# Patient Record
Sex: Female | Born: 1935 | Race: Black or African American | Hispanic: No | Smoking: Former smoker
Health system: Southern US, Community
[De-identification: ages and names within clinical notes are randomized; demographics above are authoritative.]

## PROBLEM LIST (undated history)

## (undated) DIAGNOSIS — K5792 Diverticulitis of intestine, part unspecified, without perforation or abscess without bleeding: Secondary | ICD-10-CM

## (undated) DIAGNOSIS — E039 Hypothyroidism, unspecified: Secondary | ICD-10-CM

## (undated) DIAGNOSIS — K5790 Diverticulosis of intestine, part unspecified, without perforation or abscess without bleeding: Secondary | ICD-10-CM

## (undated) DIAGNOSIS — F0390 Unspecified dementia without behavioral disturbance: Secondary | ICD-10-CM

## (undated) DIAGNOSIS — M1990 Unspecified osteoarthritis, unspecified site: Secondary | ICD-10-CM

## (undated) DIAGNOSIS — F419 Anxiety disorder, unspecified: Secondary | ICD-10-CM

## (undated) DIAGNOSIS — R911 Solitary pulmonary nodule: Secondary | ICD-10-CM

## (undated) DIAGNOSIS — R569 Unspecified convulsions: Secondary | ICD-10-CM

## (undated) DIAGNOSIS — F329 Major depressive disorder, single episode, unspecified: Secondary | ICD-10-CM

## (undated) DIAGNOSIS — N189 Chronic kidney disease, unspecified: Secondary | ICD-10-CM

## (undated) DIAGNOSIS — F32A Depression, unspecified: Secondary | ICD-10-CM

## (undated) DIAGNOSIS — I10 Essential (primary) hypertension: Secondary | ICD-10-CM

## (undated) DIAGNOSIS — K219 Gastro-esophageal reflux disease without esophagitis: Secondary | ICD-10-CM

## (undated) HISTORY — PX: PARATHYROIDECTOMY: SHX19

## (undated) HISTORY — DX: Chronic kidney disease, unspecified: N18.9

## (undated) HISTORY — DX: Unspecified dementia, unspecified severity, without behavioral disturbance, psychotic disturbance, mood disturbance, and anxiety: F03.90

## (undated) HISTORY — DX: Essential (primary) hypertension: I10

## (undated) HISTORY — DX: Gastro-esophageal reflux disease without esophagitis: K21.9

## (undated) HISTORY — DX: Hypothyroidism, unspecified: E03.9

## (undated) HISTORY — DX: Diverticulosis of intestine, part unspecified, without perforation or abscess without bleeding: K57.90

## (undated) HISTORY — DX: Solitary pulmonary nodule: R91.1

---

## 1977-10-30 HISTORY — PX: CHOLECYSTECTOMY: SHX55

## 1990-10-30 HISTORY — PX: THYROID LOBECTOMY: SHX420

## 2005-11-06 ENCOUNTER — Ambulatory Visit: Payer: Self-pay | Admitting: Cardiology

## 2005-11-13 ENCOUNTER — Ambulatory Visit: Payer: Self-pay | Admitting: Cardiology

## 2005-12-07 ENCOUNTER — Ambulatory Visit: Payer: Self-pay | Admitting: Cardiology

## 2007-02-25 ENCOUNTER — Ambulatory Visit: Payer: Self-pay | Admitting: Cardiology

## 2007-07-16 ENCOUNTER — Ambulatory Visit: Payer: Self-pay | Admitting: Cardiology

## 2008-12-12 ENCOUNTER — Ambulatory Visit: Payer: Self-pay | Admitting: Cardiology

## 2011-02-02 DIAGNOSIS — R55 Syncope and collapse: Secondary | ICD-10-CM

## 2011-05-29 DIAGNOSIS — R072 Precordial pain: Secondary | ICD-10-CM

## 2011-06-28 ENCOUNTER — Inpatient Hospital Stay (HOSPITAL_COMMUNITY)
Admission: AD | Admit: 2011-06-28 | Discharge: 2011-06-29 | DRG: 249 | Disposition: A | Discharge status: Home / Self Care | Payer: Medicare Other | Source: Other Acute Inpatient Hospital | Attending: Cardiovascular Disease | Admitting: Cardiovascular Disease

## 2011-06-28 DIAGNOSIS — I2510 Atherosclerotic heart disease of native coronary artery without angina pectoris: Secondary | ICD-10-CM

## 2011-06-28 DIAGNOSIS — Z79899 Other long term (current) drug therapy: Secondary | ICD-10-CM

## 2011-06-28 DIAGNOSIS — F0390 Unspecified dementia without behavioral disturbance: Secondary | ICD-10-CM | POA: Diagnosis present

## 2011-06-28 DIAGNOSIS — L259 Unspecified contact dermatitis, unspecified cause: Secondary | ICD-10-CM | POA: Diagnosis present

## 2011-06-28 DIAGNOSIS — I200 Unstable angina: Secondary | ICD-10-CM | POA: Diagnosis present

## 2011-06-28 DIAGNOSIS — E039 Hypothyroidism, unspecified: Secondary | ICD-10-CM | POA: Diagnosis present

## 2011-06-28 DIAGNOSIS — R079 Chest pain, unspecified: Secondary | ICD-10-CM

## 2011-06-28 DIAGNOSIS — K219 Gastro-esophageal reflux disease without esophagitis: Secondary | ICD-10-CM | POA: Diagnosis present

## 2011-06-28 DIAGNOSIS — N189 Chronic kidney disease, unspecified: Secondary | ICD-10-CM | POA: Diagnosis present

## 2011-06-28 DIAGNOSIS — I129 Hypertensive chronic kidney disease with stage 1 through stage 4 chronic kidney disease, or unspecified chronic kidney disease: Secondary | ICD-10-CM | POA: Diagnosis present

## 2011-06-28 DIAGNOSIS — Z7902 Long term (current) use of antithrombotics/antiplatelets: Secondary | ICD-10-CM

## 2011-06-28 DIAGNOSIS — Z7982 Long term (current) use of aspirin: Secondary | ICD-10-CM

## 2011-06-28 HISTORY — PX: CARDIAC CATHETERIZATION: SHX172

## 2011-06-29 DIAGNOSIS — I200 Unstable angina: Secondary | ICD-10-CM

## 2011-06-29 LAB — BASIC METABOLIC PANEL WITH GFR
BUN: 21 mg/dL (ref 6–23)
CO2: 27 meq/L (ref 19–32)
Calcium: 8.9 mg/dL (ref 8.4–10.5)
Chloride: 102 meq/L (ref 96–112)
Creatinine, Ser: 1.2 mg/dL — ABNORMAL HIGH (ref 0.50–1.10)
GFR calc Af Amer: 52 mL/min — ABNORMAL LOW
GFR calc non Af Amer: 43 mL/min — ABNORMAL LOW
Glucose, Bld: 102 mg/dL — ABNORMAL HIGH (ref 70–99)
Potassium: 3.6 meq/L (ref 3.5–5.1)
Sodium: 137 meq/L (ref 135–145)

## 2011-06-29 LAB — CBC
HCT: 32.6 % — ABNORMAL LOW (ref 36.0–46.0)
Hemoglobin: 11 g/dL — ABNORMAL LOW (ref 12.0–15.0)
MCH: 27.2 pg (ref 26.0–34.0)
MCHC: 33.7 g/dL (ref 30.0–36.0)
MCV: 80.5 fL (ref 78.0–100.0)
Platelets: 174 10*3/uL (ref 150–400)
RBC: 4.05 MIL/uL (ref 3.87–5.11)
RDW: 14.9 % (ref 11.5–15.5)
WBC: 8.8 10*3/uL (ref 4.0–10.5)

## 2011-06-29 LAB — LIPID PANEL
Cholesterol: 168 mg/dL (ref 0–200)
HDL: 50 mg/dL
LDL Cholesterol: 100 mg/dL — ABNORMAL HIGH (ref 0–99)
Total CHOL/HDL Ratio: 3.4 ratio
Triglycerides: 88 mg/dL
VLDL: 18 mg/dL (ref 0–40)

## 2011-06-29 LAB — POCT ACTIVATED CLOTTING TIME: Activated Clotting Time: 843 s

## 2011-06-29 NOTE — Discharge Summary (Addendum)
NAMESHAMEL, GALYEAN NO.:  [id number]  MEDICAL RECORD NO.:  [id number]  LOCATION:  6526                         FACILITY:  [facility]  PHYSICIAN:  Verne Carrow, MDDATE OF BIRTH:  Aug 09, 1928  DATE OF ADMISSION:  06/28/2011 DATE OF DISCHARGE:  06/29/2011                              DISCHARGE SUMMARY   DISCHARGE DIAGNOSES: 1. Unstable angina with newly diagnosed coronary artery disease this     admission.     a.     Status post angioplasty and bare-metal stent placement to      the proximal right coronary artery, June 28, 2011.     b.     Normal left ventricular function with ejection fraction of      60% by catheterization. 2. Hypertension. 3. Hypothyroidism. 4. Gastroesophageal reflux disease. 5. History of chronic kidney disease. 6. Very mild dementia. 7. Eczema. 8. History of diverticulosis. 9. Status post cholecystectomy. 10.Status post left thyroid lobectomy in 1992 for benign lesion. 11.Status post partial parathyroidectomy.  HOSPITAL COURSE:  Ms. Kirchner is an 77 year old female who has a history of hypertension and prior negative stress test in 2007 who presented to [facility] with complaints of chest pain.  She was recently admitted in July of left-sided chest pain and abdominal pain.  She ruled out for MI with negative troponin and nonspecific ST-T changes per EKG. She underwent myocardial perfusion study at that time which was essentially within normal limits.  EF was 61%.  However, she presented to [facility] with complaints of severe substernal chest pain as well as tightness that radiate to the neck and shoulders.  She appeared clammy and presyncopal and was very short of breath.  She was brought to the ER via EMS and received nitroglycerin with rapid resolution of her symptoms.  EKG showed no ischemic changes, only LVH.  Initial cardiac markers were cycled which were negative.  Despite her negative cardiac markers, her  story was felt concerning for unstable angina and she was felt suitable for cardiac catheterization.  She was transferred to [facility] and underwent this procedure on June 28, 2011 demonstrating severe one-vessel CAD in the proximal RCA for which Dr. Kirke Corin performed bare-metal stent placement to the proximal RCA.  He recommended aspirin daily indefinitely as well as Plavix 75 mg once daily for at least 1 month and ideally for 9 months.  The patient is statin intolerant.  She was started on a beta-blocker.  On day of discharge, she is feeling better and has ambulated with cardiac rehab well without chest pain or shortness of breath.  Dr. Clifton James has seen and examined her today and feels she is stable for discharge.  DISCHARGE LABORATORY DATA:  WBC 8.8, hemoglobin 11, hematocrit 32.6, and platelet count 174.  Sodium 137, potassium 3.6, chloride 102, CO2 27, glucose 102, BUN 21, and creatinine 0.2.  Cholesterol 168, triglycerides 88, HDL 60, and LDL 100.  STUDIES:  Cardiac catheterization, June 28, 2011, please see full report for details as well as HPI for summary.  DISCHARGE MEDICATIONS: 1. Plavix 75 mg daily. 2. Metoprolol tartrate 25 mg b.i.d. 3. Nitroglycerin sublingual 0.4 mg every 5 minutes as needed up  to 3     doses for chest pain. 4. Amlodipine 5 mg daily. 5. Aspirin 81 mg daily. 6. Levothyroxine 100 mcg daily. 7. Lorazepam 1 mg t.i.d. 8. Losartan 100 mg daily. 9. Meclizine 12.5 mg daily as needed for dizziness.  Per Dr. Clifton James, Nexium will be discontinued.  DISPOSITION:  Kelsey Brennan will be discharged in stable condition to home.  She is not to lift anything over 5 pounds for 1 week, drive for 2 days, and not to participate in sexual activity for 1 week.  She should follow a low-sodium, heart-healthy diet.  To call or return if she notices any pain, swelling, bleeding, or pus at her cath site.  Our office will call her with a followup appointment for Dr.  Margarita Mail office.  DURATION OF DISCHARGE ENCOUNTER:  Greater than 30 minutes including physician and PA time.     Ronie Spies, P.A.C.   ______________________________ Verne Carrow, MD    DD/MEDQ  D:  06/29/2011  T:  06/29/2011  Job:  [id number]  cc:   [name], MD,FACC [name], MD  Electronically Signed by Verne Carrow MD on 06/29/2011 05:02:35 PM Electronically Signed by Ronie Spies  on 07/10/2011 07:32:26 PM

## 2011-07-11 ENCOUNTER — Encounter: Payer: Self-pay | Admitting: *Deleted

## 2011-07-18 NOTE — Cardiovascular Report (Signed)
NAMEHONESTII, Kelsey Brennan NO.:  [id number]  MEDICAL RECORD NO.:  [id number]  LOCATION:                                 FACILITY:  PHYSICIAN:  [name], MD          DATE OF BIRTH:  DATE OF PROCEDURE:  06/28/2011 DATE OF DISCHARGE:                           CARDIAC CATHETERIZATION   PRIMARY CARE PHYSICIAN:  [name], MD, in [place].  PRIMARY CARDIOLOGIST:  [name], MD,FACC  PROCEDURES PERFORMED: 1. Left heart catheterization. 2. Coronary angiography. 3. Left ventricular angiography. 4. Right coronary artery angioplasty and bare-metal stent placement to     99% proximal right coronary artery which resulted in 0% residual     stenosis with TIMI 3 flow. 5. Perclose closure device.  INDICATIONS AND CLINICAL HISTORY:  This is an 77 year old female with no previous cardiac history.  She presented last month with symptoms of chest pain.  She had a nuclear stress test done which showed no clear evidence of ischemia.  The patient returned back this morning with the symptoms of substernal chest tightness associated with nausea and feeling sick in her stomach.  Her symptoms were highly suggestive of unstable angina and thus she was transferred for cardiac catheterization and possible coronary intervention.  Risks, benefits, and alternatives were discussed with the patient and family.  STUDY DETAILS:  A standard informed consent was obtained.  The right groin area was prepped in a sterile fashion.  It was anesthetized with 1% lidocaine.  Coronary angiography was performed with a JL-4, JR-4, and a pigtail catheter.  All catheter exchanges were done over the wire.  INTERVENTIONAL PROCEDURE NOTE:  The patient was found to have a 99% proximal right coronary artery stenosis and thus decided to proceed with an angioplasty and stent placement.  The sheath was exchanged to a 6- [place] sheath.  She already received aspirin 325 mg.  She was given 600 mg of Plavix  as well as Pepcid.  IV bivalirudin was initiated with therapeutic ACT.  I used JR-4 guiding catheter with no side holes.  The lesion was crossed with an intuition wire with slight difficulty.  The lesion was predilated with a 2.5 x 12 mm Emerge balloon with two inflations to 10 atmospheres.  I then placed a 3.5 x 15 mm Multilink Vision stent and deployed it to 12 atmospheres.  This was postdilated with a 4.0 x 12 mm Golden Quantum apex to 10 atmospheres distally and 14 atmospheres proximally.  Final angiography showed excellent results with 0% residual stenosis and TIMI 3 flow with step-up and step-down.  The guiding catheter was removed over the wire.  The sheath was removed and the site was closed with a Perclose device after performing right femoral angiography.  STUDY FINDINGS:  Hemodynamic findings:  The left ventricular pressure is 168/1 with a left ventricular end-diastolic pressure of 7 mmHg.  Aortic pressure is 167/75 with a mean pressure of 112 mmHg.  Left ventricular angiography:  This showed normal LV systolic function and wall motion with an estimated ejection fraction of 60%.  CORONARY ANGIOGRAPHY: 1. Left main coronary artery:  The vessel is normal in size and  free     of significant disease. 2. Left anterior descending artery:  The vessel is normal in size and     slightly wraps around the apex.  There is a 20% tubular stenosis     proximally and 20% tubular stenosis in the mid segment.  The rest     of the vessel has minor irregularities without any significant     obstructive disease.  Diagonals are overall small in size and free     of significant disease. 3. Left circumflex artery:  The vessel is normal in size and     nondominant.  It has minor irregularities.  OM-1 is a large-sized     branch with 30% ostial stenosis.  OM-2 and OM-3 are free of     significant disease. 4. Right coronary artery:  The vessel is large in size and dominant.     There is a 99% tubular  stenosis proximally right after giving an     atrial branch.  The rest of the artery is actually smooth and free     of significant disease.  The PDA is normal in size and free of     significant disease.  There are two small-sized posterolateral     branches which are free of significant disease.  STUDY CONCLUSIONS: 1. Severe one-vessel coronary artery disease in the proximal right     coronary artery. 2. Normal LV systolic function and left ventricular end-diastolic     pressure. 3. Successful angioplasty and bare-metal stent placement to the     proximal right coronary artery for a 95% stenosis which resulted in     0% residual stenosis.  A total of 105 mL of contrast was used.  RECOMMENDATIONS:  I recommend aspirin daily indefinitely as well as Plavix 75 mg once daily for at least 1 month and ideally for 9 months. Aggressive treatment of risk factor is recommended.     [name], MD     MA/MEDQ  D:  06/28/2011  T:  06/28/2011  Job:  [id number]  cc:   [name], MD [name], MD,FACC  Electronically Signed by [name] MD on 07/18/2011 11:24:46 AM

## 2011-07-28 ENCOUNTER — Ambulatory Visit (INDEPENDENT_AMBULATORY_CARE_PROVIDER_SITE_OTHER): Payer: Medicare Other | Admitting: Cardiology

## 2011-07-28 ENCOUNTER — Encounter: Payer: Self-pay | Admitting: *Deleted

## 2011-07-28 VITALS — BP 90/60 | HR 61 | Ht 71.0 in | Wt 231.0 lb

## 2011-07-28 DIAGNOSIS — Z9009 Acquired absence of other part of head and neck: Secondary | ICD-10-CM | POA: Insufficient documentation

## 2011-07-28 DIAGNOSIS — I2510 Atherosclerotic heart disease of native coronary artery without angina pectoris: Secondary | ICD-10-CM

## 2011-07-28 DIAGNOSIS — E039 Hypothyroidism, unspecified: Secondary | ICD-10-CM | POA: Insufficient documentation

## 2011-07-28 DIAGNOSIS — I959 Hypotension, unspecified: Secondary | ICD-10-CM | POA: Insufficient documentation

## 2011-07-28 DIAGNOSIS — Z9889 Other specified postprocedural states: Secondary | ICD-10-CM | POA: Insufficient documentation

## 2011-07-28 NOTE — Assessment & Plan Note (Signed)
No recurrent chest pain. At this point the patient will be continued for at least a month on Plavix therapy. She has had some problems with dizziness and falls which is likely related to hypotension and orthostasis. If she has recurrent problems and we cannot correct properly or orthostasis he may want to consider stopping Plavix after one month. If not would continue Plavix for at least 6 months.

## 2011-07-28 NOTE — Assessment & Plan Note (Addendum)
I discontinued losartan. The patient may still be on amlodipine but that is not clear from her current medication list. Her family will check on this. I've asked her to increase her fluid intake and apparently she doesn't drink very much. The blood pressure remains low then amlodipine should also be discontinued. The patient's LV function is normal is not a diabetic so there is no clear indication for losartan regardless.

## 2011-07-28 NOTE — Patient Instructions (Signed)
   Stop Losartan  G2 - gatorade - drink plenty  Nurse visit next week for blood pressure, orthostatics Follow up in  3 months - see above

## 2011-07-28 NOTE — Progress Notes (Signed)
History of present illness: The patient is an 77 year old female with a history of hypertension and a negative stress test in 2007. She was recently admitted at the end of August with unstable angina. She underwent cardiac catheterization and underwent stent placement with a bare-metal stent to the proximal right coronary artery 06/28/2011. She had normal LV function. She presents for followup and has been doing well. She reports no recurrent chest pain. She has other cardiac risk factors including hypertension and other medical problems including hypothyroidism, GERD chronic kidney disease and mild dementia. The patient is status post prior cholecystectomy and left thyroid lobectomy 1992 for benign lesion. She is also status post partial parathyroidectomy  Allergies-family history social history are recorded in the chart  Past medical history is most recorded above and below in the problem list and was reviewed  Medications are listed in the chart  Review of systems: The patient reports some dizziness. She even had several falls since her recent discharge. Her blood pressure has been running low. She reports bloating and flatulence. She reports no bruising with the use of Plavix despite her recent falls. She reports no true syncope. No orthopnea PND palpitations. No melena hematochezia no dysuria or frequency.  Physical examination: Vital signs as reported below with systolic blood pressure is quite low around 90 mm of mercury HEENT: Pupils isocoric, normal oropharynx. Normal carotid upstroke no carotid bruits. No thyromegaly nonnodular thyroid Lungs: Clear breath sounds bilaterally Heart: Regular rate and rhythm with normal S1 and S2 no murmur rubs or gallops Abdomen is soft nontender no rebound or guarding the bowel sounds Extremity exam no cyanosis clubbing or edema Neuro patient alert oriented grossly nonfocal with mild dementia Psychiatric normal affect  Cardiac catheterization results  were reviewed.

## 2011-08-03 ENCOUNTER — Ambulatory Visit (INDEPENDENT_AMBULATORY_CARE_PROVIDER_SITE_OTHER): Payer: Medicare Other | Admitting: *Deleted

## 2011-08-03 DIAGNOSIS — I959 Hypotension, unspecified: Secondary | ICD-10-CM

## 2011-08-03 MED ORDER — LOSARTAN POTASSIUM 100 MG PO TABS: 100.0000 mg | ORAL_TABLET | Freq: Every day | ORAL | Status: DC

## 2011-08-03 NOTE — Progress Notes (Signed)
I reviewed orthostatic blood pressures. At baseline the patient is quite hypertensive and hypotension appears to have resolved. We will resume losartan however the patient appears to have chronotropic insufficiency with no increase in heart rate during standing position. We will discontinue beta blocker in the interim. We will also apply compression stockings. We'll bring the patient back next week for blood pressure check and further adjustments of medications if needed. The patient can decrease her electrolyte intake.

## 2011-08-03 NOTE — Progress Notes (Signed)
Pt presents for orthostatic blood pressures following recent office visit with hypotension. Pt's Losartan was d/c'd at this time. Pt became dizzy during the 2 min standing BP and requested to sit down. BP had dropped considerable.   Dr. Earnestine Leys notified.

## 2011-08-03 NOTE — Patient Instructions (Signed)
   Return Monday as scheduled.  Stop Metoprolol today.  Resume Losartan at previous dose.  Wear compression stockings.

## 2011-08-07 ENCOUNTER — Ambulatory Visit (INDEPENDENT_AMBULATORY_CARE_PROVIDER_SITE_OTHER): Payer: Medicare Other | Admitting: *Deleted

## 2011-08-07 DIAGNOSIS — I4589 Other specified conduction disorders: Secondary | ICD-10-CM

## 2011-08-07 DIAGNOSIS — I2510 Atherosclerotic heart disease of native coronary artery without angina pectoris: Secondary | ICD-10-CM

## 2011-08-08 NOTE — Patient Instructions (Addendum)
   Rx given for 20-65mmhg compression stockings, below knee  Follow up OV scheduled for 11/8 with GD  No medication changes

## 2011-08-28 ENCOUNTER — Other Ambulatory Visit: Payer: Self-pay | Admitting: Internal Medicine

## 2011-09-07 ENCOUNTER — Ambulatory Visit: Payer: Medicare Other | Admitting: Cardiology

## 2011-09-20 DIAGNOSIS — R072 Precordial pain: Secondary | ICD-10-CM

## 2011-10-05 ENCOUNTER — Inpatient Hospital Stay (HOSPITAL_COMMUNITY)
Admission: AD | Admit: 2011-10-05 | Discharge: 2011-10-07 | DRG: 884 | Disposition: A | Discharge status: Home / Self Care | Payer: Medicare Other | Source: Other Acute Inpatient Hospital | Attending: Neurology | Admitting: Neurology

## 2011-10-05 DIAGNOSIS — Z87891 Personal history of nicotine dependence: Secondary | ICD-10-CM

## 2011-10-05 DIAGNOSIS — N189 Chronic kidney disease, unspecified: Secondary | ICD-10-CM | POA: Diagnosis present

## 2011-10-05 DIAGNOSIS — D509 Iron deficiency anemia, unspecified: Secondary | ICD-10-CM | POA: Diagnosis present

## 2011-10-05 DIAGNOSIS — Z7982 Long term (current) use of aspirin: Secondary | ICD-10-CM

## 2011-10-05 DIAGNOSIS — I129 Hypertensive chronic kidney disease with stage 1 through stage 4 chronic kidney disease, or unspecified chronic kidney disease: Secondary | ICD-10-CM | POA: Diagnosis present

## 2011-10-05 DIAGNOSIS — Z9861 Coronary angioplasty status: Secondary | ICD-10-CM

## 2011-10-05 DIAGNOSIS — F0390 Unspecified dementia without behavioral disturbance: Principal | ICD-10-CM | POA: Diagnosis present

## 2011-10-05 DIAGNOSIS — I2510 Atherosclerotic heart disease of native coronary artery without angina pectoris: Secondary | ICD-10-CM | POA: Diagnosis present

## 2011-10-05 DIAGNOSIS — Z79899 Other long term (current) drug therapy: Secondary | ICD-10-CM

## 2011-10-05 DIAGNOSIS — E890 Postprocedural hypothyroidism: Secondary | ICD-10-CM | POA: Diagnosis present

## 2011-10-05 DIAGNOSIS — K219 Gastro-esophageal reflux disease without esophagitis: Secondary | ICD-10-CM | POA: Diagnosis present

## 2011-10-05 MED ORDER — ASPIRIN 325 MG PO TABS
325.0000 mg | ORAL_TABLET | Freq: Every day | ORAL | Status: DC
  Administered 2011-10-05 – 2011-10-07 (×3): 325 mg via ORAL
  Filled 2011-10-05 (×3): qty 1

## 2011-10-05 MED ORDER — DIVALPROEX SODIUM ER 250 MG PO TB24
250.0000 mg | ORAL_TABLET | Freq: Every day | ORAL | Status: DC
  Administered 2011-10-05 – 2011-10-07 (×3): 250 mg via ORAL
  Filled 2011-10-05 (×3): qty 1

## 2011-10-05 MED ORDER — METOPROLOL TARTRATE 25 MG PO TABS
25.0000 mg | ORAL_TABLET | Freq: Two times a day (BID) | ORAL | Status: DC
  Administered 2011-10-05 – 2011-10-07 (×4): 25 mg via ORAL
  Filled 2011-10-05 (×5): qty 1

## 2011-10-05 MED ORDER — CLOPIDOGREL BISULFATE 75 MG PO TABS
75.0000 mg | ORAL_TABLET | Freq: Every day | ORAL | Status: DC
  Administered 2011-10-06 – 2011-10-07 (×2): 75 mg via ORAL
  Filled 2011-10-05 (×3): qty 1

## 2011-10-05 MED ORDER — LEVOTHYROXINE SODIUM 100 MCG PO TABS
100.0000 ug | ORAL_TABLET | Freq: Every day | ORAL | Status: DC
  Administered 2011-10-06 – 2011-10-07 (×2): 100 ug via ORAL
  Filled 2011-10-05 (×3): qty 1

## 2011-10-05 MED ORDER — NITROGLYCERIN 0.4 MG SL SUBL: 0.4000 mg | SUBLINGUAL_TABLET | SUBLINGUAL | Status: DC | PRN

## 2011-10-05 MED ORDER — LOSARTAN POTASSIUM 50 MG PO TABS
100.0000 mg | ORAL_TABLET | Freq: Every day | ORAL | Status: DC
  Administered 2011-10-05 – 2011-10-07 (×3): 100 mg via ORAL
  Filled 2011-10-05 (×3): qty 2

## 2011-10-05 MED ORDER — NITROGLYCERIN 0.3 MG SL SUBL
0.3000 mg | SUBLINGUAL_TABLET | SUBLINGUAL | Status: DC | PRN
  Filled 2011-10-05: qty 100

## 2011-10-05 MED ORDER — LORAZEPAM 1 MG PO TABS
1.0000 mg | ORAL_TABLET | Freq: Three times a day (TID) | ORAL | Status: DC | PRN
  Administered 2011-10-06: 1 mg via ORAL
  Filled 2011-10-05: qty 1

## 2011-10-05 NOTE — Progress Notes (Signed)
In report, I was told that the patient had been seen for confusion and weakness and was under the impression that the patient was coming here for a stroke workup. I did and NIH stroke scale and stroke swallow screen on the patient. But the MD later told me the patient was not here for stroke and to let the patient eat. However, when she took a sip of water through a straw, she coughed after. I called the MD back and he placed her on a DYS 1 Diet with Honey liquids until SLP can evaluate tomorrow.

## 2011-10-05 NOTE — Consult Note (Signed)
Reason for Consult:"confusion"  HPI: Kelsey Brennan is an 77 y.o. Female who was transferred from another hospital for episodes of confusion that were believed to be TIA's. Patient has had negative imaging each time per report in the other facility. Patient states that she had had visual hallucinations of people in the evenings and she knows that they are not real because she would reach out to try and touch them and the hand would pass through them. She says that this has been happening for about a year and thinks it has occurred about 10 times. Her son is also concerned regarding possible balance problems - she walks with walker at baseline.  Past Medical History   Diagnosis  Date   .  Hypertension    .  Dementia    .  Hypothyroidism    .  GERD (gastroesophageal reflux disease)    .  Chronic kidney disease    .  Diverticulosis    .  Nodule of right lung      noted to be stable on CT scans    Past Surgical History   Procedure  Date   .  Cardiac catheterization  06/28/2011     Severe one-vessel coronary artery disease in the proximal right coronary artery. Normal LV systolic function and left ventricular end-diastolic pressure. Successful angioplasty and bare-metal stent placement to the proximal right coronary artery for a 95% stenosis which resulted in 0% residual stenosis. A total of 105 mL of contrast was used.   .  Cholecystectomy  1979   .  Thyroid lobectomy  1992     benign lesion   .  Parathyroidectomy      partial    Family History   Problem  Relation  Age of Onset   .  Heart attack  Father    .  Other  Mother       died age 38 of some type of blood disease    .  Stroke  Sister    .  Alcohol abuse  Sister    Social History: reports that she quit smoking about 32 years ago. Her smoking use included Cigarettes. She has a 60 pack-year smoking history. She has never used smokeless tobacco. She reports that she does not drink alcohol or use illicit drugs.  Allergies:  Allergies    Allergen  Reactions   .  Bee Pollen  Swelling   .  Darvocet (Propoxyphene N-Acetaminophen)      hallucinations   .  Penicillins  Swelling   .  Statins      myalgias   Medications: I have reviewed the patient's current medications.  ROS: as above  Blood pressure 201/96, pulse 63, temperature 98.5 F (36.9 C), temperature source Oral, resp. rate 17, SpO2 100.00%.  Neurological exam: AAO*3. No aphasia. Recall was 3 of 3 after 5 minutes. Followed complex commands. Cranial nerves: EOMI, PERRL. Visual fields were full. Sensation to V1 through V3 areas of the face was intact and symmetric throughout. There was no facial asymmetry. Shoulder shrug was 5/5 and symmetric bilaterally. Head rotation was 5/5 bilaterally. There was no dysarthria or palatal deviation. Motor: strength was 5/5 and symmetric throughout. Sensory: was intact throughout to light touch, pinprick. Coordination: finger-to-nose and heel-to-shin were intact and symmetric bilaterally. Reflexes: were 1+ in upper extremities and 1+ at the knees and trace at the ankles. Plantar response was downgoing bilaterally. Gait: deferred  Assessment/Plan:  77-years-old woman with visual hallucinations and episodes of 

## 2011-10-06 ENCOUNTER — Inpatient Hospital Stay (HOSPITAL_COMMUNITY): Payer: Medicare Other

## 2011-10-06 LAB — CBC
HCT: 31.3 % — ABNORMAL LOW (ref 36.0–46.0)
Hemoglobin: 9.9 g/dL — ABNORMAL LOW (ref 12.0–15.0)
MCH: 27 pg (ref 26.0–34.0)
MCHC: 31.6 g/dL (ref 30.0–36.0)

## 2011-10-06 LAB — BASIC METABOLIC PANEL
BUN: 19 mg/dL (ref 6–23)
Calcium: 9 mg/dL (ref 8.4–10.5)
GFR calc non Af Amer: 39 mL/min — ABNORMAL LOW (ref >90)
Glucose, Bld: 105 mg/dL — ABNORMAL HIGH (ref 70–99)
Sodium: 143 mEq/L (ref 135–145)

## 2011-10-06 MED ORDER — SODIUM CHLORIDE 0.9 % IV SOLN
Freq: Once | INTRAVENOUS | Status: AC
  Administered 2011-10-06: 17:00:00 via INTRAVENOUS

## 2011-10-06 MED ORDER — OCUVITE PO TABS
1.0000 | ORAL_TABLET | Freq: Every day | ORAL | Status: DC
  Administered 2011-10-07: 1 via ORAL
  Filled 2011-10-06: qty 1

## 2011-10-06 MED ORDER — DONEPEZIL HCL 5 MG PO TABS
5.0000 mg | ORAL_TABLET | Freq: Every day | ORAL | Status: DC
  Administered 2011-10-06: 5 mg via ORAL
  Filled 2011-10-06 (×2): qty 1

## 2011-10-06 MED ORDER — RISPERIDONE 0.5 MG PO TABS
0.5000 mg | ORAL_TABLET | Freq: Every day | ORAL | Status: DC
  Administered 2011-10-06: 0.5 mg via ORAL
  Filled 2011-10-06 (×3): qty 1

## 2011-10-06 NOTE — Progress Notes (Addendum)
Physical Therapy Evaluation Patient Details Name: Kelsey Brennan MRN: [id number] DOB: 04-21-1928 Today's Date: 10/06/2011  Problem List:  Patient Active Problem List  Diagnoses  . Coronary artery disease  . Hypotension  . Hypothyroidism  . History of thyroidectomy, subtotal  . S/P subtotal parathyroidectomy    Past Medical History:  Past Medical History  Diagnosis Date  . Hypertension   . Dementia   . Hypothyroidism   . GERD (gastroesophageal reflux disease)   . Chronic kidney disease   . Diverticulosis   . Nodule of right lung     noted to be stable on CT scans   Past Surgical History:  Past Surgical History  Procedure Date  . Cardiac catheterization 06/28/2011    Severe one-vessel coronary artery disease in the proximal right  coronary artery.  Normal LV systolic function and left ventricular end-diastolic  pressure.  Successful angioplasty and bare-metal stent placement to the proximal right coronary artery for a 95% stenosis which resulted in   0% residual stenosis.  A total of 105 mL of contrast was used.   . Cholecystectomy 1979  . Thyroid lobectomy 1992    benign lesion  . Parathyroidectomy     partial    PT Assessment/Plan/Recommendation Patient states she has 24 hour care.    PT Assessment Clinical Impression Statement: Patient appears close to baseline in regards to her mobility status.  Will not follow. Recommend HHPT in the home for safety evaluation.   PT Recommendation/Assessment: All further PT needs can be met in the next venue of care PT Problem List: Decreased safety awareness;Decreased mobility;Decreased balance PT Therapy Diagnosis : Generalized weakness PT Recommendation Follow Up Recommendations: Home health PT Equipment Recommended: Defer to next venue  PT Goals   N/A  PT Evaluation Precautions/Restrictions    Prior Functioning  Home Living Lives With: Daughter (24 hour care) Receives Help From: Personal care attendant;Family (M - F  aide 4 hours day.) Type of Home: House Home Layout: One level Home Access: Stairs to enter Entrance Stairs-Rails: Can reach both Entrance Stairs-Number of Steps: 3 Bathroom Shower/Tub: Health visitor: Standard Home Adaptive Equipment: Straight cane;Walker - rolling;Bedside commode/3-in-1;Shower chair with back Prior Function Level of Independence: Requires assistive device for independence;Needs assistance with ADLs Bath: Moderate Toileting: Moderate Dressing: Moderate Driving: No Vocation: Retired Producer, television/film/video: Awake/alert Overall Cognitive Status: Impaired Attention: Impaired Current Attention Level: Sustained Memory: Appears impaired Memory Deficits: Appears to have some STM deficits and LTM deficits. Orientation Level: Oriented to person;Oriented to place;Oriented to time Following Commands: Follows one step commands inconsistently;Follows multi-step commands inconsistently;Follows one step commands with increased time;Follows multi-step commands with increased time Safety/Judgement: Good awareness of safety precautions;Good safety judgement for tasks assessed Awareness of Errors: Good awareness of errors made Sensation/Coordination Sensation Light Touch: Appears Intact Stereognosis: Not tested Hot/Cold: Not tested Proprioception: Not tested Coordination Gross Motor Movements are Fluid and Coordinated: Yes Fine Motor Movements are Fluid and Coordinated: Yes Extremity Assessment RUE Assessment RUE Assessment: Within Functional Limits LUE Assessment LUE Assessment: Within Functional Limits RLE Assessment RLE Assessment: Within Functional Limits LLE Assessment LLE Assessment: Within Functional Limits Mobility (including Balance) Bed Mobility Bed Mobility: Yes Supine to Sit: 5: Supervision Transfers Transfers: Yes Sit to Stand: 5: Supervision Stand to Sit: 5: Supervision Ambulation/Gait Ambulation/Gait:  Yes Ambulation/Gait Assistance: 5: Supervision Ambulation/Gait Assistance Details (indicate cue type and reason): Pt. able to ambulate with moderate challenges without loss of balance.  Slightly wide BOS.  No other gait deviations. Ambulation  Distance (Feet): 75 Feet Assistive device: None Stairs: No Wheelchair Mobility Wheelchair Mobility: No  Posture/Postural Control Posture/Postural Control: No significant limitations Balance Balance Assessed: Yes Dynamic Standing Balance Dynamic Standing - Balance Support: No upper extremity supported Dynamic Standing - Level of Assistance: 5: Stand by assistance Dynamic Standing - Balance Activities: Lateral lean/weight shifting;Reaching for objects Exercise    End of Session PT - End of Session Equipment Utilized During Treatment: Gait belt Activity Tolerance: Patient tolerated treatment well Patient left: in chair;with call bell in reach Nurse Communication: Mobility status for ambulation General Behavior During Session: [facility] for tasks performed Cognition: Impaired, at baseline  INGOLD,Jamyla Ard 10/06/2011, 12:48 PM [facility] [phone] (pager)

## 2011-10-06 NOTE — H&P (Signed)
Reason for Consult:"confusion"  HPI: Kelsey Brennan is an 77 y.o. Female who was transferred from another hospital for episodes of confusion that were believed to be TIA's. Patient has had negative imaging each time per report in the other facility. Patient states that she had had visual hallucinations of people in the evenings and she knows that they are not real because she would reach out to try and touch them and the hand would pass through them. She says that this has been happening for about a year and thinks it has occurred about 10 times. Her son is also concerned regarding possible balance problems - she walks with walker at baseline.  Past Medical History   Diagnosis  Date   .  Hypertension    .  Dementia    .  Hypothyroidism    .  GERD (gastroesophageal reflux disease)    .  Chronic kidney disease    .  Diverticulosis    .  Nodule of right lung      noted to be stable on CT scans    Past Surgical History   Procedure  Date   .  Cardiac catheterization  06/28/2011     Severe one-vessel coronary artery disease in the proximal right coronary artery. Normal LV systolic function and left ventricular end-diastolic pressure. Successful angioplasty and bare-metal stent placement to the proximal right coronary artery for a 95% stenosis which resulted in 0% residual stenosis. A total of 105 mL of contrast was used.   .  Cholecystectomy  1979   .  Thyroid lobectomy  1992     benign lesion   .  Parathyroidectomy      partial    Family History   Problem  Relation  Age of Onset   .  Heart attack  Father    .  Other  Mother       died age 49 of some type of blood disease    .  Stroke  Sister    .  Alcohol abuse  Sister    Social History: reports that she quit smoking about 32 years ago. Her smoking use included Cigarettes. She has a 60 pack-year smoking history. She has never used smokeless tobacco. She reports that she does not drink alcohol or use illicit drugs.  Allergies:  Allergies    Allergen  Reactions   .  Bee Pollen  Swelling   .  Darvocet (Propoxyphene N-Acetaminophen)      hallucinations   .  Penicillins  Swelling   .  Statins      myalgias   Medications: I have reviewed the patient's current medications.  ROS: as above  Blood pressure 201/96, pulse 63, temperature 98.5 F (36.9 C), temperature source Oral, resp. rate 17, SpO2 100.00%.  Neurological exam: AAO*3. No aphasia. Recall was 3 of 3 after 5 minutes. Followed complex commands. Cranial nerves: EOMI, PERRL. Visual fields were full. Sensation to V1 through V3 areas of the face was intact and symmetric throughout. There was no facial asymmetry. Shoulder shrug was 5/5 and symmetric bilaterally. Head rotation was 5/5 bilaterally. There was no dysarthria or palatal deviation. Motor: strength was 5/5 and symmetric throughout. Sensory: was intact throughout to light touch, pinprick. Coordination: finger-to-nose and heel-to-shin were intact and symmetric bilaterally. Reflexes: were 1+ in upper extremities and 1+ at the knees and trace at the ankles. Plantar response was downgoing bilaterally. Gait: deferred  Assessment/Plan:  77 years old woman with visual hallucinations and episodes of  confusion - typically at night. Possibly "sundowning" given history of dementia. We may get a psychiatry consult to evaluate her tomorrow. PT and Speech will see her as well.  Jigar Zielke  10/05/2011, 6:27 PM

## 2011-10-06 NOTE — Progress Notes (Signed)
Utilization review completed. Ayla Dunigan, RN, BSN. 10/06/11  

## 2011-10-06 NOTE — Consult Note (Signed)
Patient Identification:  Kelsey Brennan Date of Evaluation:  10/06/2011   History of Present Illness: This is a 77 years old patient who was admitted to the [facility] from the [facility]. Patient has been suffering with the poor energy, decreased to psychmotor activity and unable to function in usual fashion at admission. Patient is having the visual hallucinations for a while. Patient has seen two people, two watermelons etc. she has been somewhat confused and does not know what to do. Patient son was at bedside who reported she has been recovered from her energy and stronger physically but not the emotionally and cognitively. Patient was unable to recall some orientation questions during this visit. Patient obviously suffering with the mild  memory loss. Patient has been physically strong and has good strength, given the history of hypertension, hypothyroidism, diverticulosis, nodule of the right lungs and GERD. Patient has normal neurological exam and workup so far. Patient has a plan of going for the he MRI scan soon.  Past psychiatric history he was not significant patient has no history of for a few psychiatric hospitalizations are medication management.  Mental Status Examination/Evaluation: Patient appeared with a hospital gown, semi-sitting in her bed has a mild decreased psychomotor activity, her stated mood was fine and her affect was constricted. She has the normal speech which he is coherent. She has denied suicidal or homicidal ideations. She has visual hallucinations without neurological evidence and denied auditory hallucinations and paranoia. Patient has a decrease in the cognitive functions especially regarding names and she was failed to count numbers downwards.  Past Medical History:     Past Medical History  Diagnosis Date  . Hypertension   . Dementia   . Hypothyroidism   . GERD (gastroesophageal reflux disease)   . Chronic kidney disease   .  Diverticulosis   . Nodule of right lung     noted to be stable on CT scans       Past Surgical History  Procedure Date  . Cardiac catheterization 06/28/2011    Severe one-vessel coronary artery disease in the proximal right  coronary artery.  Normal LV systolic function and left ventricular end-diastolic  pressure.  Successful angioplasty and bare-metal stent placement to the proximal right coronary artery for a 95% stenosis which resulted in   0% residual stenosis.  A total of 105 mL of contrast was used.   . Cholecystectomy 1979  . Thyroid lobectomy 1992    benign lesion  . Parathyroidectomy     partial    Allergies:  Allergies  Allergen Reactions  . Bee Pollen Swelling  . Darvocet (Propoxyphene N-Acetaminophen)     hallucinations  . Penicillins Swelling  . Statins     myalgias    Current Medications:  Prior to Admission medications   Medication Sig Start Date End Date Taking? Authorizing Provider  acetaminophen (TYLENOL) 500 MG tablet Take 500 mg by mouth every 6 (six) hours as needed. For pain    Yes Historical Provider, MD  aspirin EC 325 MG tablet Take 325 mg by mouth daily.     Yes Historical Provider, MD  clopidogrel (PLAVIX) 75 MG tablet Take 75 mg by mouth daily.     Yes Historical Provider, MD  divalproex (DEPAKOTE) 250 MG DR tablet Take 250 mg by mouth at bedtime.     Yes Historical Provider, MD  levothyroxine (SYNTHROID, LEVOTHROID) 100 MCG tablet Take 100 mcg by mouth daily.     Yes Historical  Provider, MD  LORazepam (ATIVAN) 1 MG tablet Take 1 mg by mouth every 8 (eight) hours as needed. For anxiety   Yes Historical Provider, MD  losartan (COZAAR) 100 MG tablet Take 100 mg by mouth daily.   08/03/11 08/02/12 Yes Peyton Bottoms, MD  meclizine (ANTIVERT) 12.5 MG tablet Take 12.5 mg by mouth 3 (three) times daily as needed. For dizziness    Yes Historical Provider, MD  metoprolol tartrate (LOPRESSOR) 25 MG tablet   08/28/11  Yes Peyton Bottoms, MD  nitroGLYCERIN  (NITROSTAT) 0.4 MG SL tablet Place 0.4 mg under the tongue every 5 (five) minutes as needed. For chest pain   Yes Historical Provider, MD  vitamin B-12 (CYANOCOBALAMIN) 1000 MCG tablet Take 1,000 mcg by mouth daily.     Yes Historical Provider, MD    Social History:    reports that she quit smoking about 32 years ago. Her smoking use included Cigarettes. She has a 60 pack-year smoking history. She has never used smokeless tobacco. She reports that she does not drink alcohol or use illicit drugs.   Family History:    Family History  Problem Relation Age of Onset  . Heart attack Father   . Other Mother     died age 63 of some type of blood disease  . Stroke Sister   . Alcohol abuse Sister       DIAGNOSIS:   AXIS I  psychosis not otherwise specified, dementia, mild   AXIS II  Deffered  AXIS III See medical notes.  AXIS IV  moderate psychosocial stressors cognitive deficits   AXIS V  45-55      Recommendations: #1. Recommend Aricept 5-10 mg daily for dementia. #2. Recommend Risperdal 0.5 mg at bedtime for hallucinations #3. Recommended no aching psychiatric hospitalization #4. Monitor for the adverse effect of the medications #5. Contact psychiatric consultation if needed further assistance    Leata Mouse, MD

## 2011-10-06 NOTE — Consult Note (Signed)
Subjective: Patient is doing well. Had some visual hallucinations last night.   Objective: Vital signs in last 24 hours: Temp:  [98 F (36.7 C)-98.5 F (36.9 C)] 98 F (36.7 C) (12/07 0600) Pulse Rate:  [61-70] 61  (12/07 0600) Resp:  [17-18] 18  (12/07 0600) BP: (121-201)/(60-96) 154/87 mmHg (12/07 0600) SpO2:  [96 %-100 %] 97 % (12/07 0600)  Nutritional status: Dysphagia  Neurological exam: AAO*3. No aphasia. Recall was 3 of 3 after 5 minutes. Followed complex commands. Cranial nerves: EOMI, PERRL. Visual fields were full. Sensation to V1 through V3 areas of the face was intact and symmetric throughout. There was no facial asymmetry. Shoulder shrug was 5/5 and symmetric bilaterally. Head rotation was 5/5 bilaterally. There was no dysarthria or palatal deviation. Motor: strength was 5/5 and symmetric throughout. Sensory: was intact throughout to light touch, pinprick. Coordination: finger-to-nose and heel-to-shin were intact and symmetric bilaterally. Reflexes: were 1+ in upper extremities and 1+ at the knees and trace at the ankles. Plantar response was downgoing bilaterally. Gait: wide-based  Lab Results:  Basename 10/06/11 0615  WBC 6.9  HGB 9.9*  HCT 31.3*  PLT 174  NA 143  K 4.3  CL 107  CO2 31  GLUCOSE 105*  BUN 19  CREATININE 1.25*  CALCIUM 9.0  LABA1C --   Medications: I have reviewed the patient's current medications.  Assessment/Plan: 77 years old woman with episode of generalized weakness and confusion that resolved. She has had these episodes in the past. 1) MRI brain 2) Orthostatics and Telemetry - for lightheadedness 3) Iron, B12/Folate 4) IV fluids  LOS: 1 day   Mihail Prettyman

## 2011-10-06 NOTE — Progress Notes (Signed)
Speech Language/Pathology Clinical/Bedside Swallow Evaluation Patient Details  Name: Kelsey Brennan MRN: [id number] DOB: 1928-06-18 Today's Date: 10/06/2011  Past Medical History:  Past Medical History  Diagnosis Date  . Hypertension   . Dementia   . Hypothyroidism   . GERD (gastroesophageal reflux disease)   . Chronic kidney disease   . Diverticulosis   . Nodule of right lung     noted to be stable on CT scans   Past Surgical History:  Past Surgical History  Procedure Date  . Cardiac catheterization 06/28/2011    Severe one-vessel coronary artery disease in the proximal right  coronary artery.  Normal LV systolic function and left ventricular end-diastolic  pressure.  Successful angioplasty and bare-metal stent placement to the proximal right coronary artery for a 95% stenosis which resulted in   0% residual stenosis.  A total of 105 mL of contrast was used.   . Cholecystectomy 1979  . Thyroid lobectomy 1992    benign lesion  . Parathyroidectomy     partial    Assessment/Recommendations/Treatment Plan    SLP Assessment Clinical Impression Statement: Patient shows no overt s/s of aspiration at this time.  Swallows were timely, with good laryngeal elevation palpated, no cough or audible throat clearing after swallowing, and voice remained clear. Risk for Aspiration: Mild (Very minimal.) Other Related Risk Factors: Cognitive impairment  Recommendations Solid Consistency: Dysphagia 3 (Mechanical soft) Liquid Consistency: Thin Liquid Administration via: Straw Medication Administration: Other (Comment) (Whole with liquid if tolerated; whole with puree if not.) Supervision: Patient able to self feed Compensations: Slow rate;Small sips/bites Postural Changes and/or Swallow Maneuvers: Seated upright 90 degrees;Out of bed for meals Oral Care Recommendations: Oral care BID Other Recommendations: Clarify dietary restrictions  Prognosis Prognosis for Safe Diet Advancement:  Good  Individuals Consulted Consulted and Agree with Results and Recommendations: Patient  Swallow Study Goals  SLP Swallowing Goals Patient will consume recommended diet without observed clinical signs of aspiration with: Modified independent assistance  Swallow Study Prior Functional Status     General  Other Pertinent Information: RN reports patient coughed after drinking sip of H20 from straw.  Patient denies swallowing difficulty or frequent choking. Type of Study: Bedside swallow evaluation Diet Prior to this Study: Regular;Thin liquids ("I don't eat anything hard or crunchy.") Temperature Spikes Noted: No Respiratory Status: Room air History of Intubation: No Behavior/Cognition: Alert;Cooperative;Pleasant mood Oral Cavity - Dentition: Missing dentition (Most lower teeth are missing.) Vision: Functional for self-feeding Patient Positioning: Upright in chair Baseline Vocal Quality: Normal Volitional Cough: Strong Volitional Swallow: Able to elicit Ice chips: Not tested  Oral Motor/Sensory Function  Labial ROM: Within Functional Limits Labial Symmetry: Within Functional Limits Labial Strength: Within Functional Limits Labial Sensation: Within Functional Limits Lingual ROM: Within Functional Limits Lingual Symmetry: Within Functional Limits Lingual Strength: Within Functional Limits Lingual Sensation: Within Functional Limits Facial ROM: Within Functional Limits Facial Symmetry: Within Functional Limits Facial Strength: Within Functional Limits Facial Sensation: Within Functional Limits Velum: Within Functional Limits Mandible: Within Functional Limits  Consistency Results     Thin Liquid Thin Liquid: Within functional limits Presentation: Self Fed;Straw;Cup  Nectar Thick Liquid Nectar Thick Liquid: Within functional limits Presentation: Cup;Self Fed     Puree Puree: Within functional limits  Solid Solid: Within functional limits Presentation: Self  Daine Gravel, Nadia Torr T 10/06/2011,10:45 AM

## 2011-10-07 DIAGNOSIS — F29 Unspecified psychosis not due to a substance or known physiological condition: Secondary | ICD-10-CM

## 2011-10-07 DIAGNOSIS — F0390 Unspecified dementia without behavioral disturbance: Principal | ICD-10-CM

## 2011-10-07 LAB — BASIC METABOLIC PANEL
Chloride: 103 mEq/L (ref 96–112)
GFR calc Af Amer: 42 mL/min — ABNORMAL LOW (ref >90)
Potassium: 3.7 mEq/L (ref 3.5–5.1)
Sodium: 140 mEq/L (ref 135–145)

## 2011-10-07 LAB — VITAMIN B12: Vitamin B-12: 1315 pg/mL — ABNORMAL HIGH (ref 211–911)

## 2011-10-07 MED ORDER — RISPERIDONE 0.5 MG PO TABS: 0.5000 mg | ORAL_TABLET | Freq: Every day | ORAL | Status: DC

## 2011-10-07 MED ORDER — DONEPEZIL HCL 5 MG PO TABS: 5.0000 mg | ORAL_TABLET | Freq: Every day | ORAL | Status: DC

## 2011-10-07 MED ORDER — ASPIRIN 325 MG PO TABS: 325.0000 mg | ORAL_TABLET | Freq: Every day | ORAL | Status: DC

## 2011-10-07 MED ORDER — CLOPIDOGREL BISULFATE 75 MG PO TABS: 75.0000 mg | ORAL_TABLET | Freq: Every day | ORAL | Status: DC

## 2011-10-07 MED ORDER — LEVOTHYROXINE SODIUM 100 MCG PO TABS: 100.0000 ug | ORAL_TABLET | Freq: Every day | ORAL | Status: DC

## 2011-10-07 MED ORDER — LOSARTAN POTASSIUM 50 MG PO TABS: 100.0000 mg | ORAL_TABLET | Freq: Every day | ORAL | Status: DC

## 2011-10-07 MED ORDER — OCUVITE PO TABS: 1.0000 | ORAL_TABLET | Freq: Every day | ORAL | Status: AC

## 2011-10-07 MED ORDER — FERROUS SULFATE 325 (65 FE) MG PO TABS
325.0000 mg | ORAL_TABLET | Freq: Two times a day (BID) | ORAL | Status: DC
  Filled 2011-10-07 (×2): qty 1

## 2011-10-07 MED ORDER — NITROGLYCERIN 0.4 MG SL SUBL: 0.4000 mg | SUBLINGUAL_TABLET | SUBLINGUAL | Status: DC | PRN

## 2011-10-07 MED ORDER — METOPROLOL TARTRATE 25 MG PO TABS: 25.0000 mg | ORAL_TABLET | Freq: Two times a day (BID) | ORAL | Status: DC

## 2011-10-07 MED ORDER — OCUVITE PO TABS: 1.0000 | ORAL_TABLET | Freq: Every day | ORAL | Status: DC

## 2011-10-07 MED ORDER — LORAZEPAM 1 MG PO TABS: 1.0000 mg | ORAL_TABLET | Freq: Three times a day (TID) | ORAL | Status: DC | PRN

## 2011-10-07 MED ORDER — DIVALPROEX SODIUM ER 250 MG PO TB24
250.0000 mg | ORAL_TABLET | Freq: Every day | ORAL | Status: DC
Start: 2011-10-07 — End: 2011-10-07
  Filled 2011-10-07: qty 1

## 2011-10-07 MED ORDER — FERROUS SULFATE 325 (65 FE) MG PO TABS: 325.0000 mg | ORAL_TABLET | Freq: Two times a day (BID) | ORAL | Status: DC

## 2011-10-07 NOTE — Discharge Summary (Signed)
  Kelsey Brennan has dementia with associated visual hallucinations and confusion, which typically occurs at night. She was seen by psychiatry and started on Risperdal 0.5 mg PO qhs for agitation. She was anemic with low iron, so we started her on Ferrous Sulfate 325 mg PO bid. She will continue taking Aricept. Being in a familiar environment at home she facilitate less confusional episiodes in the future.  She is advised to follow-up with her primary medical doctor and outpatient neurologist.   Carmell Austria, MD

## 2011-10-07 NOTE — Progress Notes (Signed)
SL removed from R hand, no bleeding noted, catheter tip intact.  Pt and daughter given d/c instructions along with script for iron, aricept, risperdal.  Pt advised to f/u with PCP for her diagnosis of dementia with hallucinations.  Pt d/c'd home via w/c accompanied by medical staff and family.

## 2011-10-07 NOTE — Discharge Summary (Signed)
  Physician Discharge Summary  Patient ID: Kelsey Brennan MRN: [id number] DOB/AGE: 04-08-1928 77 y.o.  Admit date: 10/05/2011 Discharge date: 10/07/2011  Admission Diagnoses:  Discharge Diagnoses:  Active Problems:  * No active hospital problems. *    Discharged Condition: good  Hospital Course: Admitted for episodes of confusion and sundowning. Patient has dementia. Was seen by psych and started on Risperdal .5 mg PO qhs for agitation. Started on Ferrous Sulfate 325 mg PO bid for iron deficiency anemia.  Consults: none  Significant Diagnostic Studies: radiology: MRI brain: no acute CVA  Treatments: cardiac meds: metoprolol  Discharge Exam: Blood pressure 138/80, pulse 80, temperature 98.6 F (37 C), temperature source Oral, resp. rate 22, SpO2 98.00%. AAO*3, no aphasia, followed complex commands, EOMI, PERRL, no facial asymmetry, tongue midline, V1-V3 intact bilaterally, tongue midline, no drift, strength 5/5 bilaterally, sensory: no deficit to LT/PP throughout, Coord: no dysmetria on F to N bilaterally, Reflexes: 2+ in UE b/l, 1+ in LE b/l, 1+ ankle reflexes, gait: no ataxia  Disposition: Home or Self Care  Discharge Orders    Future Appointments: Provider: Department: Dept Phone: Center:   10/18/2011 2:00 PM Peyton Bottoms, MD [facility] [phone] [facility]     Current Discharge Medication List    CONTINUE these medications which have NOT CHANGED   Details  acetaminophen (TYLENOL) 500 MG tablet Take 500 mg by mouth every 6 (six) hours as needed. For pain     aspirin EC 325 MG tablet Take 325 mg by mouth daily.      clopidogrel (PLAVIX) 75 MG tablet Take 75 mg by mouth daily.      divalproex (DEPAKOTE) 250 MG DR tablet Take 250 mg by mouth at bedtime.      levothyroxine (SYNTHROID, LEVOTHROID) 100 MCG tablet Take 100 mcg by mouth daily.      LORazepam (ATIVAN) 1 MG tablet Take 1 mg by mouth every 8 (eight) hours as needed. For anxiety    losartan (COZAAR)  100 MG tablet Take 100 mg by mouth daily.      meclizine (ANTIVERT) 12.5 MG tablet Take 12.5 mg by mouth 3 (three) times daily as needed. For dizziness     metoprolol tartrate (LOPRESSOR) 25 MG tablet      nitroGLYCERIN (NITROSTAT) 0.4 MG SL tablet Place 0.4 mg under the tongue every 5 (five) minutes as needed. For chest pain    vitamin B-12 (CYANOCOBALAMIN) 1000 MCG tablet Take 1,000 mcg by mouth daily.           SignedLyman Speller, Kelsey Brennan 10/07/2011, 1:36 PM

## 2011-10-09 LAB — FOLATE RBC: RBC Folate: 785 ng/mL — ABNORMAL HIGH (ref >=366)

## 2011-10-18 ENCOUNTER — Encounter: Payer: Self-pay | Admitting: Cardiology

## 2011-10-18 ENCOUNTER — Ambulatory Visit (INDEPENDENT_AMBULATORY_CARE_PROVIDER_SITE_OTHER): Payer: Medicare Other | Admitting: Cardiology

## 2011-10-18 VITALS — BP 145/79 | HR 70 | Ht 71.0 in | Wt 232.0 lb

## 2011-10-18 DIAGNOSIS — I2510 Atherosclerotic heart disease of native coronary artery without angina pectoris: Secondary | ICD-10-CM

## 2011-10-18 DIAGNOSIS — I959 Hypotension, unspecified: Secondary | ICD-10-CM

## 2011-10-18 DIAGNOSIS — E039 Hypothyroidism, unspecified: Secondary | ICD-10-CM

## 2011-10-18 NOTE — Assessment & Plan Note (Signed)
Blood pressure under good control. Patient is slightly hypertensive but given her age and chronic kidney disease I am not planning to lower her blood pressure any further.

## 2011-10-18 NOTE — Progress Notes (Signed)
Kelsey Bottoms, MD, [facility] ABIM Board Certified in Adult Cardiovascular Medicine,Internal Medicine and Critical Care Medicine    CC: followup recent admission for chest pain in a patient coronary artery disease  HPI:  The patient is an elderly female with a history of coronary artery disease status post bare-metal stent to the RCA in August of 2012 this was in the setting of an acute coronary syndrome and the patient is committed to dual antiplatelet therapy for one year. She also has hypertension, GERD, chronic kidney disease and dementia. She was recently hospitalized at [facility] for chest pain. She ruled out for myocardial infarction and was discharged. She states that she is doing well she has no recurrent chest pain shortness of breath orthopnea or PND. The patient still able to do her daily activities and at times walks with a cane to make sure she does not lose her balance. She reports no syncope     PMH: reviewed and listed in Problem List in Electronic Records (and see below) Past Medical History  Diagnosis Date  . Hypertension   . Dementia   . Hypothyroidism   . GERD (gastroesophageal reflux disease)   . Chronic kidney disease   . Diverticulosis   . Nodule of right lung     noted to be stable on CT scans   Past Surgical History  Procedure Date  . Cardiac catheterization 06/28/2011    Severe one-vessel coronary artery disease in the proximal right  coronary artery.  Normal LV systolic function and left ventricular end-diastolic  pressure.  Successful angioplasty and bare-metal stent placement to the proximal right coronary artery for a 95% stenosis which resulted in   0% residual stenosis.  A total of 105 mL of contrast was used.   . Cholecystectomy 1979  . Thyroid lobectomy 1992    benign lesion  . Parathyroidectomy     partial    Allergies/SH/FHX : available in Electronic Records for review  Allergies  Allergen Reactions  . Bee Pollen Swelling  . Darvocet  (Propoxyphene N-Acetaminophen)     hallucinations  . Penicillins Swelling  . Statins     myalgias   History   Social History  . Marital Status: Divorced    Spouse Name: N/A    Number of Children: N/A  . Years of Education: N/A   Occupational History  . Not on file.   Social History Main Topics  . Smoking status: Former Smoker -- 2.0 packs/day for 30 years    Types: Cigarettes    Quit date: 10/30/1978  . Smokeless tobacco: Never Used  . Alcohol Use: No     quit in 1980's  . Drug Use: No  . Sexually Active: Not on file   Other Topics Concern  . Not on file   Social History Narrative   Lives with daughter and grandson.She has 6 children, 5 boys and 4 of whom are living and 1 daughter.Has 18 grandchildren.Very  Involved in church   Family History  Problem Relation Age of Onset  . Heart attack Father   . Other Mother     died age 44 of some type of blood disease  . Stroke Sister   . Alcohol abuse Sister     Medications: Current Outpatient Prescriptions  Medication Sig Dispense Refill  . aspirin EC 325 MG tablet Take 325 mg by mouth daily.        . beta carotene w/minerals (OCUVITE) tablet Take 1 tablet by mouth daily.  30  tablet  6  . clopidogrel (PLAVIX) 75 MG tablet Take 75 mg by mouth daily.        . divalproex (DEPAKOTE) 250 MG DR tablet Take 500 mg by mouth at bedtime.       Marland Kitchen esomeprazole (NEXIUM) 40 MG capsule Take 40 mg by mouth daily before breakfast.        . ferrous sulfate 325 (65 FE) MG tablet Take 1 tablet (325 mg total) by mouth 2 (two) times daily with a meal.  30 tablet  6  . levothyroxine (SYNTHROID, LEVOTHROID) 100 MCG tablet Take 100 mcg by mouth daily.        Marland Kitchen LORazepam (ATIVAN) 1 MG tablet Take 1 mg by mouth every 8 (eight) hours as needed. For anxiety      . losartan (COZAAR) 100 MG tablet Take 100 mg by mouth daily.        . meclizine (ANTIVERT) 12.5 MG tablet Take 12.5 mg by mouth 3 (three) times daily as needed. For dizziness       .  metoprolol tartrate (LOPRESSOR) 25 MG tablet Take 25 mg by mouth 2 (two) times daily.       . nitroGLYCERIN (NITROSTAT) 0.4 MG SL tablet Place 0.4 mg under the tongue every 5 (five) minutes as needed. For chest pain      . vitamin B-12 (CYANOCOBALAMIN) 1000 MCG tablet Take 1,000 mcg by mouth daily.          ROS: No nausea or vomiting. No fever or chills.No melena or hematochezia.No bleeding.No claudication.  Physical Exam: BP 145/79  Pulse 70  Ht 5\' 11"  (1.803 m)  Wt 232 lb (105.235 kg)  BMI 32.36 kg/m2 General: Overweight African American female in no distress Neck: Normal carotid upstroke no carotid bruits. No thyromegaly nonnodular thyroid Lungs: Clear breath sounds bilaterally no wheezing Cardiac: Regular rate and rhythm with normal S1-S2 no murmur rubs or gallops Vascular: No edema. Normal distal pulses Skin: Warm and dry Physcologic: Normal affect  12lead ECG: Not performed Limited bedside ECHO:N/A   Assessment and Plan

## 2011-10-18 NOTE — Patient Instructions (Signed)
Continue all current medications. Your physician wants you to follow up in: 6 months.  You will receive a reminder letter in the mail one-two months in advance.  If you don't receive a letter, please call our office to schedule the follow up appointment   

## 2011-10-18 NOTE — Assessment & Plan Note (Signed)
Patient recently hospitalized for recurrent chest pain. She ruled out for myocardial infarction. She is aware to take a dual antiplatelet therapy for at least a year after her original stent, albeit a bare-metal stent put in the setting of an acute coronary syndrome in August of 2012. The patient reports no bleeding complications.

## 2011-10-18 NOTE — Assessment & Plan Note (Signed)
Followed by Dr. Daniel 

## 2011-12-18 ENCOUNTER — Other Ambulatory Visit (HOSPITAL_COMMUNITY): Payer: Self-pay | Admitting: Internal Medicine

## 2013-02-23 DIAGNOSIS — R079 Chest pain, unspecified: Secondary | ICD-10-CM

## 2013-02-24 ENCOUNTER — Encounter: Payer: Self-pay | Admitting: Physician Assistant

## 2013-02-24 DIAGNOSIS — R079 Chest pain, unspecified: Secondary | ICD-10-CM

## 2013-03-07 ENCOUNTER — Encounter: Payer: Self-pay | Admitting: Physician Assistant

## 2013-05-20 ENCOUNTER — Ambulatory Visit (INDEPENDENT_AMBULATORY_CARE_PROVIDER_SITE_OTHER): Payer: Medicare Other | Admitting: Cardiovascular Disease

## 2013-05-20 ENCOUNTER — Encounter: Payer: Self-pay | Admitting: Cardiovascular Disease

## 2013-05-20 VITALS — BP 158/89 | HR 56 | Ht 70.0 in | Wt 239.1 lb

## 2013-05-20 DIAGNOSIS — I10 Essential (primary) hypertension: Secondary | ICD-10-CM | POA: Insufficient documentation

## 2013-05-20 DIAGNOSIS — I2510 Atherosclerotic heart disease of native coronary artery without angina pectoris: Secondary | ICD-10-CM

## 2013-05-20 MED ORDER — METOPROLOL TARTRATE 25 MG PO TABS: 25.0000 mg | ORAL_TABLET | Freq: Every day | ORAL | Status: DC

## 2013-05-20 NOTE — Progress Notes (Signed)
Patient ID: Kelsey Brennan, female   DOB: [birth year], 79 y.o.   MRN: [id number]    SUBJECTIVE: The patient is an elderly female with a history of coronary artery disease status post bare-metal stent to the RCA in August of 2012 in the setting of an acute coronary syndrome.  She also has hypertension, GERD, chronic kidney disease and dementia.    She states that she is doing well she has no recurrent chest pain, shortness of breath, orthopnea or PND. The patient still able to do her daily activities and at times walks with a cane or a walker to make sure she does not lose her balance.   She reports no syncope and hasn't been dizzy or lightheaded lately.   PMH: reviewed and listed in Problem List in Electronic Records (and see below)       Past Medical History   Diagnosis  Date   .  Hypertension    .  Dementia    .  Hypothyroidism    .  GERD (gastroesophageal reflux disease)    .  Chronic kidney disease    .  Diverticulosis    .  Nodule of right lung      noted to be stable on CT scans    Past Surgical History   Procedure  Date   .  Cardiac catheterization  06/28/2011     Severe one-vessel coronary artery disease in the proximal right coronary artery. Normal LV systolic function and left ventricular end-diastolic pressure. Successful angioplasty and bare-metal stent placement to the proximal right coronary artery for a 95% stenosis which resulted in 0% residual stenosis. A total of 105 mL of contrast was used.   .  Cholecystectomy  1979   .  Thyroid lobectomy  1992     benign lesion   .  Parathyroidectomy      partial   Allergies/SH/FHX : available in Electronic Records for review  Allergies   Allergen  Reactions   .  Bee Pollen  Swelling   .  Darvocet (Propoxyphene N-Acetaminophen)      hallucinations   .  Penicillins  Swelling   .  Statins      myalgias    BP 158/89  Pulse 56    PHYSICAL EXAM General: NAD Neck: No JVD, no thyromegaly or thyroid nodule.  Lungs:  Clear to auscultation bilaterally with normal respiratory effort. CV: Nondisplaced PMI.  Heart regular S1/S2, no S3/S4, no murmur.  No peripheral edema.  No carotid bruit.  Normal pedal pulses.  Abdomen: Soft, nontender, no hepatosplenomegaly, no distention.  Neurologic: Alert and oriented x 3.  Psych: Normal affect. Extremities: No clubbing or cyanosis.     LABS: Basic Metabolic Panel: No results found for this basename: NA, K, CL, CO2, GLUCOSE, BUN, CREATININE, CALCIUM, MG, PHOS,  in the last 72 hours Liver Function Tests: No results found for this basename: AST, ALT, ALKPHOS, BILITOT, PROT, ALBUMIN,  in the last 72 hours No results found for this basename: LIPASE, AMYLASE,  in the last 72 hours CBC: No results found for this basename: WBC, NEUTROABS, HGB, HCT, MCV, PLT,  in the last 72 hours Cardiac Enzymes: No results found for this basename: CKTOTAL, CKMB, CKMBINDEX, TROPONINI,  in the last 72 hours BNP: No components found with this basename: POCBNP,  D-Dimer: No results found for this basename: DDIMER,  in the last 72 hours Hemoglobin A1C: No results found for this basename: HGBA1C,  in the last 72 hours Fasting  Lipid Panel: No results found for this basename: CHOL, HDL, LDLCALC, TRIG, CHOLHDL, LDLDIRECT,  in the last 72 hours Thyroid Function Tests: No results found for this basename: TSH, T4TOTAL, FREET3, T3FREE, THYROIDAB,  in the last 72 hours Anemia Panel: No results found for this basename: VITAMINB12, FOLATE, FERRITIN, TIBC, IRON, RETICCTPCT,  in the last 72 hours    ASSESSMENT AND PLAN: 1. CAD: asymptomatic and stable. Continue ASA and Metoprolol. Due to fatigue, I will reduce her Metoprolol to 25 mg q pm. 2. HTN: uncontrolled today, but she has a visiting nurse at home who checks it regularly, and it is reportedly normal. She was on HCTZ 25 mg daily, but it was stopped due to hypotension. I've asked her daughter to keep a record of it and to inform me in a  month.   Prentice Docker, M.D., F.A.C.C.

## 2013-05-20 NOTE — Patient Instructions (Addendum)
Your physician has recommended you make the following change in your medication:   REDUCE METOPROLOL TARTRATE TO 25 MG ONCE A DAY  Your physician wants you to follow-up in: 1 YEAR You will receive a reminder letter in the mail two months in advance. If you don't receive a letter, please call our office to schedule the follow-up appointment.

## 2013-06-18 ENCOUNTER — Other Ambulatory Visit: Payer: Self-pay | Admitting: Neurology

## 2013-06-18 DIAGNOSIS — R4182 Altered mental status, unspecified: Secondary | ICD-10-CM

## 2013-06-24 ENCOUNTER — Ambulatory Visit (HOSPITAL_COMMUNITY): Payer: Medicare Other

## 2013-10-25 ENCOUNTER — Emergency Department (HOSPITAL_COMMUNITY): Payer: Medicare Other

## 2013-10-25 ENCOUNTER — Other Ambulatory Visit: Payer: Self-pay

## 2013-10-25 ENCOUNTER — Emergency Department (HOSPITAL_COMMUNITY)
Admission: EM | Admit: 2013-10-25 | Discharge: 2013-10-25 | Disposition: A | Discharge status: Home / Self Care | Payer: Medicare Other | Attending: Emergency Medicine | Admitting: Emergency Medicine

## 2013-10-25 ENCOUNTER — Encounter (HOSPITAL_COMMUNITY): Payer: Self-pay | Admitting: Emergency Medicine

## 2013-10-25 DIAGNOSIS — Z79899 Other long term (current) drug therapy: Secondary | ICD-10-CM | POA: Insufficient documentation

## 2013-10-25 DIAGNOSIS — N189 Chronic kidney disease, unspecified: Secondary | ICD-10-CM | POA: Insufficient documentation

## 2013-10-25 DIAGNOSIS — Z9089 Acquired absence of other organs: Secondary | ICD-10-CM | POA: Insufficient documentation

## 2013-10-25 DIAGNOSIS — F0390 Unspecified dementia without behavioral disturbance: Secondary | ICD-10-CM | POA: Diagnosis not present

## 2013-10-25 DIAGNOSIS — R5381 Other malaise: Secondary | ICD-10-CM | POA: Diagnosis not present

## 2013-10-25 DIAGNOSIS — I129 Hypertensive chronic kidney disease with stage 1 through stage 4 chronic kidney disease, or unspecified chronic kidney disease: Secondary | ICD-10-CM | POA: Diagnosis not present

## 2013-10-25 DIAGNOSIS — K5730 Diverticulosis of large intestine without perforation or abscess without bleeding: Secondary | ICD-10-CM | POA: Diagnosis not present

## 2013-10-25 DIAGNOSIS — K219 Gastro-esophageal reflux disease without esophagitis: Secondary | ICD-10-CM | POA: Insufficient documentation

## 2013-10-25 DIAGNOSIS — Z880 Allergy status to penicillin: Secondary | ICD-10-CM | POA: Insufficient documentation

## 2013-10-25 DIAGNOSIS — E039 Hypothyroidism, unspecified: Secondary | ICD-10-CM | POA: Diagnosis not present

## 2013-10-25 DIAGNOSIS — N390 Urinary tract infection, site not specified: Secondary | ICD-10-CM | POA: Diagnosis not present

## 2013-10-25 DIAGNOSIS — R109 Unspecified abdominal pain: Secondary | ICD-10-CM | POA: Diagnosis present

## 2013-10-25 DIAGNOSIS — R52 Pain, unspecified: Secondary | ICD-10-CM | POA: Insufficient documentation

## 2013-10-25 DIAGNOSIS — Z8709 Personal history of other diseases of the respiratory system: Secondary | ICD-10-CM | POA: Insufficient documentation

## 2013-10-25 DIAGNOSIS — Z7982 Long term (current) use of aspirin: Secondary | ICD-10-CM | POA: Insufficient documentation

## 2013-10-25 DIAGNOSIS — Z95818 Presence of other cardiac implants and grafts: Secondary | ICD-10-CM | POA: Insufficient documentation

## 2013-10-25 DIAGNOSIS — Z87891 Personal history of nicotine dependence: Secondary | ICD-10-CM | POA: Insufficient documentation

## 2013-10-25 HISTORY — DX: Major depressive disorder, single episode, unspecified: F32.9

## 2013-10-25 HISTORY — DX: Diverticulitis of intestine, part unspecified, without perforation or abscess without bleeding: K57.92

## 2013-10-25 HISTORY — DX: Anxiety disorder, unspecified: F41.9

## 2013-10-25 HISTORY — DX: Depression, unspecified: F32.A

## 2013-10-25 HISTORY — DX: Unspecified convulsions: R56.9

## 2013-10-25 HISTORY — DX: Unspecified osteoarthritis, unspecified site: M19.90

## 2013-10-25 LAB — URINE MICROSCOPIC-ADD ON

## 2013-10-25 LAB — CBC WITH DIFFERENTIAL/PLATELET
Basophils Absolute: 0 10*3/uL (ref 0.0–0.1)
Basophils Relative: 0 % (ref 0–1)
Eosinophils Absolute: 0.1 10*3/uL (ref 0.0–0.7)
Eosinophils Relative: 1 % (ref 0–5)
HCT: 35.9 % — ABNORMAL LOW (ref 36.0–46.0)
Hemoglobin: 11.7 g/dL — ABNORMAL LOW (ref 12.0–15.0)
MCH: 28.7 pg (ref 26.0–34.0)
MCHC: 32.6 g/dL (ref 30.0–36.0)
MCV: 88.2 fL (ref 78.0–100.0)
Monocytes Absolute: 0.6 10*3/uL (ref 0.1–1.0)
Monocytes Relative: 8 % (ref 3–12)
Neutro Abs: 5.3 10*3/uL (ref 1.7–7.7)
RDW: 15.6 % — ABNORMAL HIGH (ref 11.5–15.5)

## 2013-10-25 LAB — URINALYSIS, ROUTINE W REFLEX MICROSCOPIC
Bilirubin Urine: NEGATIVE
Hgb urine dipstick: NEGATIVE
Ketones, ur: NEGATIVE mg/dL
Nitrite: NEGATIVE
Protein, ur: 30 mg/dL — AB
Specific Gravity, Urine: >1.03 — ABNORMAL HIGH (ref 1.005–1.030)
Urobilinogen, UA: 1 mg/dL (ref 0.0–1.0)

## 2013-10-25 LAB — COMPREHENSIVE METABOLIC PANEL
Albumin: 2.9 g/dL — ABNORMAL LOW (ref 3.5–5.2)
Alkaline Phosphatase: 55 U/L (ref 39–117)
BUN: 36 mg/dL — ABNORMAL HIGH (ref 6–23)
Creatinine, Ser: 1.46 mg/dL — ABNORMAL HIGH (ref 0.50–1.10)
GFR calc Af Amer: 37 mL/min — ABNORMAL LOW (ref >90)
Glucose, Bld: 91 mg/dL (ref 70–99)
Potassium: 3.7 mEq/L (ref 3.5–5.1)
Total Protein: 7.1 g/dL (ref 6.0–8.3)

## 2013-10-25 LAB — LIPASE, BLOOD: Lipase: 24 U/L (ref 11–59)

## 2013-10-25 MED ORDER — SODIUM CHLORIDE 0.9 % IV SOLN
Freq: Once | INTRAVENOUS | Status: AC
  Administered 2013-10-25: 12:00:00 via INTRAVENOUS

## 2013-10-25 MED ORDER — IOHEXOL 300 MG/ML  SOLN
80.0000 mL | Freq: Once | INTRAMUSCULAR | Status: AC | PRN
  Administered 2013-10-25: 80 mL via INTRAVENOUS

## 2013-10-25 MED ORDER — NITROFURANTOIN MONOHYD MACRO 100 MG PO CAPS: 100.0000 mg | ORAL_CAPSULE | Freq: Two times a day (BID) | ORAL | Status: AC

## 2013-10-25 MED ORDER — IOHEXOL 300 MG/ML  SOLN
50.0000 mL | Freq: Once | INTRAMUSCULAR | Status: AC | PRN
  Administered 2013-10-25: 50 mL via ORAL

## 2013-10-25 NOTE — ED Provider Notes (Signed)
CSN: [id number]     Arrival date & time 10/25/13  1126 History  This chart was scribed for Donnetta Hutching, MD by Smiley Houseman, ED Scribe. The patient was seen in room APA02/APA02. Patient's care was started at 11:46 AM.   Chief Complaint  Patient presents with  . Abdominal Pain   Level 5 Caveat for Dementia The history is provided by the patient. No language interpreter was used.    HPI Comments: Kelsey Brennan is a 79 y.o. female with a h/o of diverticulitis who presents to the Emergency Department complaining of lower abdominal pain that started about a month ago.  She reports the pain is worsened by eating, but she is still able to eat.  Pt also complains of associated fatigue and generalized body aches.  Pt denies nausea, vomiting, and diarrhea.  Pt has visited her PCP for this complaint PTA and told her it was diet related and she need to eat better.    PCP-Dr. Reuel Boom   Past Medical History  Diagnosis Date  . Hypertension   . Dementia   . Hypothyroidism   . GERD (gastroesophageal reflux disease)   . Chronic kidney disease   . Diverticulosis   . Nodule of right lung     noted to be stable on CT scans  . Diverticulitis    Past Surgical History  Procedure Laterality Date  . Cardiac catheterization  06/28/2011    Severe one-vessel coronary artery disease in the proximal right  coronary artery.  Normal LV systolic function and left ventricular end-diastolic  pressure.  Successful angioplasty and bare-metal stent placement to the proximal right coronary artery for a 95% stenosis which resulted in   0% residual stenosis.  A total of 105 mL of contrast was used.   . Cholecystectomy  1979  . Thyroid lobectomy  1992    benign lesion  . Parathyroidectomy      partial   Family History  Problem Relation Age of Onset  . Heart attack Father   . Other Mother     died age 18 of some type of blood disease  . Stroke Sister   . Alcohol abuse Sister    History  Substance Use Topics  .  Smoking status: Former Smoker -- 2.00 packs/day for 30 years    Types: Cigarettes    Quit date: 10/30/1978  . Smokeless tobacco: Never Used  . Alcohol Use: No     Comment: quit in 1980's   OB History   Grav Para Term Preterm Abortions TAB SAB Ect Mult Living                 Review of Systems A complete 10 system review of systems was obtained and all systems are negative except as noted in the HPI and PMH.    Allergies  Bee pollen; Darvocet; Penicillins; and Statins  Home Medications   Current Outpatient Rx  Name  Route  Sig  Dispense  Refill  . acetaminophen (TYLENOL) 500 MG tablet   Oral   Take 500-1,000 mg by mouth every 6 (six) hours as needed for pain.         Marland Kitchen aspirin 81 MG tablet   Oral   Take 81 mg by mouth daily.         . calcium carbonate (TUMS - DOSED IN MG ELEMENTAL CALCIUM) 500 MG chewable tablet   Oral   Chew 2 tablets by mouth as needed for heartburn.         Marland Kitchen  dextromethorphan (DELSYM) 30 MG/5ML liquid   Oral   Take 1,060 mg by mouth as needed for cough.         . divalproex (DEPAKOTE) 250 MG DR tablet   Oral   Take 500 mg by mouth at bedtime.          . docusate sodium (COLACE) 250 MG capsule   Oral   Take 250 mg by mouth daily as needed for constipation.         Marland Kitchen donepezil (ARICEPT) 5 MG tablet   Oral   Take 5 mg by mouth at bedtime.         Marland Kitchen escitalopram (LEXAPRO) 10 MG tablet   Oral   Take 10 mg by mouth daily.         Marland Kitchen esomeprazole (NEXIUM) 40 MG capsule   Oral   Take 40 mg by mouth 2 (two) times daily.          Marland Kitchen levothyroxine (SYNTHROID, LEVOTHROID) 100 MCG tablet   Oral   Take 100 mcg by mouth daily.           Marland Kitchen loratadine (CLARITIN) 10 MG tablet   Oral   Take 10 mg by mouth daily.         Marland Kitchen LORazepam (ATIVAN) 1 MG tablet   Oral   Take 1 mg by mouth 4 (four) times daily as needed. For anxiety         . EXPIRED: losartan (COZAAR) 100 MG tablet   Oral   Take 100 mg by mouth daily.            . meclizine (ANTIVERT) 12.5 MG tablet   Oral   Take 12.5 mg by mouth 3 (three) times daily as needed. For dizziness          . metoprolol tartrate (LOPRESSOR) 25 MG tablet   Oral   Take 1 tablet (25 mg total) by mouth daily.   30 tablet   4   . multivitamin-lutein (OCUVITE-LUTEIN) CAPS   Oral   Take 1 capsule by mouth daily.         . nitroGLYCERIN (NITROSTAT) 0.4 MG SL tablet   Sublingual   Place 0.4 mg under the tongue every 5 (five) minutes as needed. For chest pain         . vitamin B-12 (CYANOCOBALAMIN) 1000 MCG tablet   Oral   Take 1,000 mcg by mouth daily.            Triage Vitals: BP 190/87  Pulse 70  Temp(Src) 98.1 F (36.7 C) (Oral)  Resp 20  Ht 5\' 6"  (1.676 m)  Wt 239 lb (108.41 kg)  BMI 38.59 kg/m2  SpO2 99% Physical Exam  Nursing note and vitals reviewed. Constitutional: She appears well-developed and well-nourished.  HENT:  Head: Normocephalic and atraumatic.  Eyes: Conjunctivae and EOM are normal. Pupils are equal, round, and reactive to light.  Neck: Normal range of motion. Neck supple.  Cardiovascular: Normal rate, regular rhythm and normal heart sounds.   Pulmonary/Chest: Effort normal and breath sounds normal.  Abdominal: Soft. Bowel sounds are normal.  Musculoskeletal: Normal range of motion.  Neurological: She is alert.  Skin: Skin is warm and dry.  Psychiatric: Her behavior is normal.    ED Course  Procedures (including critical care time) DIAGNOSTIC STUDIES: Oxygen Saturation is 99% on RA, normal by my interpretation.    COORDINATION OF CARE: 11:50 AM-Will order UA, basic lab, and CT of abdomen. Patient informed  of current plan of treatment and evaluation and agrees with plan.     Labs Review Labs Reviewed  CBC WITH DIFFERENTIAL - Abnormal; Notable for the following:    Hemoglobin 11.7 (*)    HCT 35.9 (*)    RDW 15.6 (*)    All other components within normal limits  URINALYSIS, ROUTINE W REFLEX MICROSCOPIC - Abnormal;  Notable for the following:    APPearance CLOUDY (*)    Specific Gravity, Urine >1.030 (*)    Protein, ur 30 (*)    All other components within normal limits  COMPREHENSIVE METABOLIC PANEL - Abnormal; Notable for the following:    BUN 36 (*)    Creatinine, Ser 1.46 (*)    Albumin 2.9 (*)    GFR calc non Af Amer 32 (*)    GFR calc Af Amer 37 (*)    All other components within normal limits  URINE MICROSCOPIC-ADD ON - Abnormal; Notable for the following:    Squamous Epithelial / LPF FEW (*)    Bacteria, UA FEW (*)    All other components within normal limits  URINE CULTURE  LIPASE, BLOOD   Imaging Review Ct Abdomen Pelvis W Contrast  10/25/2013   CLINICAL DATA:  Abdominal pain, history, dementia, chronic kidney disease, diverticulosis, GERD  EXAM: CT ABDOMEN AND PELVIS WITH CONTRAST  TECHNIQUE: Multidetector CT imaging of the abdomen and pelvis was performed using the standard protocol following bolus administration of intravenous contrast. Sagittal and coronal MPR images reconstructed from axial data set.  CONTRAST:  50mL OMNIPAQUE IOHEXOL 300 MG/ML SOLN orally, 80mL OMNIPAQUE IOHEXOL 300 MG/ML SOLN IV  COMPARISON:  09/18/2013  FINDINGS: Dependent atelectasis at both lung bases.  Tiny bibasilar pleural effusions.  Large hiatal hernia.  Atherosclerotic calcifications aorta and coronary arteries.  Gallbladder surgically absent.  Liver, spleen, pancreas, and adrenal glands normal appearance.  Small bilateral renal cysts.  Nonobstructing calculus upper pole left kidney 9 mm diameter image 33.  Uterine leiomyomata.  No definite hydronephrosis or ureteral dilatation.  Bladder unremarkable.  Scattered pelvic calcifications and phleboliths.  Diverticulosis of distal colon without evidence of diverticulitis.  Stomach and remaining bowel loops otherwise unremarkable.  No mass, adenopathy, free fluid or inflammatory process.  Scattered degenerative disc disease changes of the thoracolumbar spine.   IMPRESSION: Distal colonic diverticulosis without evidence of diverticulitis.  Nonobstructing left renal calculus.  Uterine leiomyomata.  Large hiatal hernia.  Bibasilar atelectasis and tiny pleural effusions.   Electronically Signed   By: Ulyses Southward M.D.   On: 10/25/2013 13:51   Dg Hand Complete Left  10/25/2013   CLINICAL DATA:  Left hand pain and erythema.  EXAM: LEFT HAND - COMPLETE 3+ VIEW  COMPARISON:  None.  FINDINGS: No acute fracture or dislocation is identified. No significant arthropathy is identified. There is no evidence of bony lesion or destruction. Soft tissues are unremarkable.  IMPRESSION: Unremarkable left hand radiographs.   Electronically Signed   By: Irish Lack M.D.   On: 10/25/2013 14:07    EKG Interpretation   None       Date: 10/25/2013  Rate: 55  Rhythm: sinus brady  QRS Axis: normal  Intervals: normal  ST/T Wave abnormalities: normal  Conduction Disutrbances: none  Narrative Interpretation: unremarkable    MDM  No diagnosis found. No acute abdomen. CT scan shows no acute findings. Minor evidence of urinary tract infection. Rx Macrobid for 5 days. Creatinine noted to be minimally elevated. Hemoglobin and white count stable  I personally performed the services described in this documentation, which was scribed in my presence. The recorded information has been reviewed and is accurate.    Donnetta Hutching, MD 10/25/13 1515

## 2013-10-25 NOTE — ED Notes (Signed)
ems arrived for transport to home.

## 2013-10-25 NOTE — ED Notes (Signed)
Pt ate entire bowl of soup, fed self. Pt cleaned and linens changed, had voided incont. On self. Awaiting family to return for discharge.

## 2013-10-25 NOTE — ED Notes (Signed)
Pt cleaned of incont. Urine. Linens changed. Awaiting transport to home. Family at bedside.

## 2013-10-25 NOTE — ED Notes (Signed)
Pt arrived from home by ems, pt reports ab pain for 1 month. Pt denies any nausea, vomiting or diarrhea. Has been to her pmd for the same and she is unsure what he said.

## 2013-10-25 NOTE — ED Notes (Signed)
Pt returned from ct scan. Alert and denies any complaints at the present

## 2013-10-26 LAB — URINE CULTURE
Colony Count: NO GROWTH
Culture: NO GROWTH

## 2013-11-27 ENCOUNTER — Emergency Department (HOSPITAL_COMMUNITY): Payer: Medicare Other

## 2013-11-27 ENCOUNTER — Emergency Department (HOSPITAL_COMMUNITY)
Admission: EM | Admit: 2013-11-27 | Discharge: 2013-11-28 | Disposition: A | Discharge status: Home / Self Care | Payer: Medicare Other | Attending: Emergency Medicine | Admitting: Emergency Medicine

## 2013-11-27 DIAGNOSIS — G40909 Epilepsy, unspecified, not intractable, without status epilepticus: Secondary | ICD-10-CM | POA: Insufficient documentation

## 2013-11-27 DIAGNOSIS — R531 Weakness: Secondary | ICD-10-CM

## 2013-11-27 DIAGNOSIS — E039 Hypothyroidism, unspecified: Secondary | ICD-10-CM | POA: Insufficient documentation

## 2013-11-27 DIAGNOSIS — Z792 Long term (current) use of antibiotics: Secondary | ICD-10-CM | POA: Insufficient documentation

## 2013-11-27 DIAGNOSIS — M129 Arthropathy, unspecified: Secondary | ICD-10-CM | POA: Insufficient documentation

## 2013-11-27 DIAGNOSIS — R5381 Other malaise: Secondary | ICD-10-CM | POA: Insufficient documentation

## 2013-11-27 DIAGNOSIS — Z880 Allergy status to penicillin: Secondary | ICD-10-CM | POA: Insufficient documentation

## 2013-11-27 DIAGNOSIS — F411 Generalized anxiety disorder: Secondary | ICD-10-CM | POA: Insufficient documentation

## 2013-11-27 DIAGNOSIS — N189 Chronic kidney disease, unspecified: Secondary | ICD-10-CM | POA: Insufficient documentation

## 2013-11-27 DIAGNOSIS — R5383 Other fatigue: Principal | ICD-10-CM

## 2013-11-27 DIAGNOSIS — I129 Hypertensive chronic kidney disease with stage 1 through stage 4 chronic kidney disease, or unspecified chronic kidney disease: Secondary | ICD-10-CM | POA: Insufficient documentation

## 2013-11-27 DIAGNOSIS — K219 Gastro-esophageal reflux disease without esophagitis: Secondary | ICD-10-CM | POA: Insufficient documentation

## 2013-11-27 DIAGNOSIS — E860 Dehydration: Secondary | ICD-10-CM | POA: Insufficient documentation

## 2013-11-27 DIAGNOSIS — F3289 Other specified depressive episodes: Secondary | ICD-10-CM | POA: Insufficient documentation

## 2013-11-27 DIAGNOSIS — Z79899 Other long term (current) drug therapy: Secondary | ICD-10-CM | POA: Insufficient documentation

## 2013-11-27 DIAGNOSIS — F329 Major depressive disorder, single episode, unspecified: Secondary | ICD-10-CM | POA: Insufficient documentation

## 2013-11-27 DIAGNOSIS — Z87891 Personal history of nicotine dependence: Secondary | ICD-10-CM | POA: Insufficient documentation

## 2013-11-27 DIAGNOSIS — Z9889 Other specified postprocedural states: Secondary | ICD-10-CM | POA: Insufficient documentation

## 2013-11-27 DIAGNOSIS — F0390 Unspecified dementia without behavioral disturbance: Secondary | ICD-10-CM | POA: Insufficient documentation

## 2013-11-27 DIAGNOSIS — Z7982 Long term (current) use of aspirin: Secondary | ICD-10-CM | POA: Insufficient documentation

## 2013-11-27 LAB — URINALYSIS, ROUTINE W REFLEX MICROSCOPIC
BILIRUBIN URINE: NEGATIVE
GLUCOSE, UA: NEGATIVE mg/dL
HGB URINE DIPSTICK: NEGATIVE
KETONES UR: NEGATIVE mg/dL
Leukocytes, UA: NEGATIVE
Nitrite: NEGATIVE
PH: 6 (ref 5.0–8.0)
Protein, ur: NEGATIVE mg/dL
Specific Gravity, Urine: 1.02 (ref 1.005–1.030)
Urobilinogen, UA: 1 mg/dL (ref 0.0–1.0)

## 2013-11-27 LAB — COMPREHENSIVE METABOLIC PANEL
ALBUMIN: 2.8 g/dL — AB (ref 3.5–5.2)
ALK PHOS: 64 U/L (ref 39–117)
ALT: 6 U/L (ref 0–35)
AST: 10 U/L (ref 0–37)
BUN: 36 mg/dL — ABNORMAL HIGH (ref 6–23)
CO2: 30 mEq/L (ref 19–32)
Calcium: 8.7 mg/dL (ref 8.4–10.5)
Chloride: 103 mEq/L (ref 96–112)
Creatinine, Ser: 1.19 mg/dL — ABNORMAL HIGH (ref 0.50–1.10)
GFR calc Af Amer: 48 mL/min — ABNORMAL LOW (ref >90)
GFR calc non Af Amer: 41 mL/min — ABNORMAL LOW (ref >90)
GLUCOSE: 103 mg/dL — AB (ref 70–99)
Potassium: 3.7 mEq/L (ref 3.7–5.3)
SODIUM: 143 meq/L (ref 137–147)
Total Bilirubin: 0.2 mg/dL — ABNORMAL LOW (ref 0.3–1.2)
Total Protein: 7.1 g/dL (ref 6.0–8.3)

## 2013-11-27 LAB — CBC WITH DIFFERENTIAL/PLATELET
BASOS PCT: 0 % (ref 0–1)
Basophils Absolute: 0 10*3/uL (ref 0.0–0.1)
EOS ABS: 0 10*3/uL (ref 0.0–0.7)
Eosinophils Relative: 1 % (ref 0–5)
HCT: 34.6 % — ABNORMAL LOW (ref 36.0–46.0)
Hemoglobin: 11.6 g/dL — ABNORMAL LOW (ref 12.0–15.0)
LYMPHS ABS: 0.9 10*3/uL (ref 0.7–4.0)
Lymphocytes Relative: 14 % (ref 12–46)
MCH: 29.1 pg (ref 26.0–34.0)
MCHC: 33.5 g/dL (ref 30.0–36.0)
MCV: 86.9 fL (ref 78.0–100.0)
Monocytes Absolute: 0.6 10*3/uL (ref 0.1–1.0)
Monocytes Relative: 10 % (ref 3–12)
Neutro Abs: 4.8 10*3/uL (ref 1.7–7.7)
Neutrophils Relative %: 75 % (ref 43–77)
Platelets: 157 10*3/uL (ref 150–400)
RBC: 3.98 MIL/uL (ref 3.87–5.11)
RDW: 15.1 % (ref 11.5–15.5)
WBC: 6.4 10*3/uL (ref 4.0–10.5)

## 2013-11-27 LAB — LACTIC ACID, PLASMA: Lactic Acid, Venous: 1 mmol/L (ref 0.5–2.2)

## 2013-11-27 LAB — POCT I-STAT TROPONIN I: Troponin i, poc: 0 ng/mL (ref 0.00–0.08)

## 2013-11-27 MED ORDER — SODIUM CHLORIDE 0.9 % IV SOLN
1000.0000 mL | INTRAVENOUS | Status: DC
  Administered 2013-11-27: 1000 mL via INTRAVENOUS

## 2013-11-27 MED ORDER — SODIUM CHLORIDE 0.9 % IV SOLN
1000.0000 mL | Freq: Once | INTRAVENOUS | Status: AC
  Administered 2013-11-27: 1000 mL via INTRAVENOUS

## 2013-11-27 NOTE — Discharge Instructions (Signed)
As discussed, your evaluation today was largely reassuring for the absence of acute new disease.  However, your CAT scan demonstrated that you have likely had small strokes over time.  Is important to follow up with both a primary care physician and your neurologist for further evaluation and management.

## 2013-11-27 NOTE — ED Provider Notes (Signed)
CSN: [id number]     Arrival date & time 11/27/13  2009 History  This chart was scribed for Carmin Muskrat, MD by Rolanda Lundborg, ED Scribe. This patient was seen in room APA02/APA02 and the patient's care was started at 8:17 PM.    Chief Complaint  Patient presents with  . Weakness  . Dehydration   The history is provided by the patient. The history is limited by the condition of the patient. No language interpreter was used.   Level 5 Caveat - Dementia  HPI Comments: Kelsey Brennan is a 80 y.o. female brought in by ambulance who presents to the Emergency Department complaining of generalized weakness onset 2-3 weeks ago. She states she has a physical therapist that comes to her house but she has been too weak to do the exercises. She denies any pain, nausea, leg swelling. She denies new medications. Pt lives with her daughter. Family states pt has been weak on the left side since Christmas but they did not take her for testing.    Past Medical History  Diagnosis Date  . Hypertension   . Dementia   . Hypothyroidism   . GERD (gastroesophageal reflux disease)   . Chronic kidney disease   . Diverticulosis   . Nodule of right lung     noted to be stable on CT scans  . Diverticulitis   . Seizures   . Anxiety   . Depression   . Arthritis    Past Surgical History  Procedure Laterality Date  . Cardiac catheterization  06/28/2011    Severe one-vessel coronary artery disease in the proximal right  coronary artery.  Normal LV systolic function and left ventricular end-diastolic  pressure.  Successful angioplasty and bare-metal stent placement to the proximal right coronary artery for a 95% stenosis which resulted in   0% residual stenosis.  A total of 105 mL of contrast was used.   . Cholecystectomy  1979  . Thyroid lobectomy  1992    benign lesion  . Parathyroidectomy      partial   Family History  Problem Relation Age of Onset  . Heart attack Father   . Other Mother     died age 51  of some type of blood disease  . Stroke Sister   . Alcohol abuse Sister    History  Substance Use Topics  . Smoking status: Former Smoker -- 2.00 packs/day for 30 years    Types: Cigarettes    Quit date: 10/30/1978  . Smokeless tobacco: Never Used  . Alcohol Use: No     Comment: quit in 1980's   OB History   Grav Para Term Preterm Abortions TAB SAB Ect Mult Living                 Review of Systems  Unable to perform ROS: Dementia    Allergies  Bee pollen; Darvocet; Penicillins; and Statins  Home Medications   Current Outpatient Rx  Name  Route  Sig  Dispense  Refill  . acetaminophen (TYLENOL) 500 MG tablet   Oral   Take 1,000 mg by mouth every 6 (six) hours as needed.         Marland Kitchen aspirin 81 MG tablet   Oral   Take 81 mg by mouth daily.         . calcium carbonate (TUMS - DOSED IN MG ELEMENTAL CALCIUM) 500 MG chewable tablet   Oral   Chew 2 tablets by mouth as needed for heartburn.         Marland Kitchen  divalproex (DEPAKOTE) 250 MG DR tablet   Oral   Take 750 mg by mouth at bedtime.          . donepezil (ARICEPT) 10 MG tablet   Oral   Take 10 mg by mouth at bedtime.         Marland Kitchen escitalopram (LEXAPRO) 10 MG tablet   Oral   Take 10 mg by mouth daily.         Marland Kitchen esomeprazole (NEXIUM) 40 MG capsule   Oral   Take 40 mg by mouth 2 (two) times daily.          Marland Kitchen levothyroxine (SYNTHROID, LEVOTHROID) 100 MCG tablet   Oral   Take 100 mcg by mouth daily.           Marland Kitchen loratadine (CLARITIN) 10 MG tablet   Oral   Take 10 mg by mouth daily.         Marland Kitchen LORazepam (ATIVAN) 1 MG tablet   Oral   Take 1 mg by mouth every 6 (six) hours as needed for anxiety. For anxiety         . losartan (COZAAR) 100 MG tablet   Oral   Take 100 mg by mouth daily.         . meclizine (ANTIVERT) 12.5 MG tablet   Oral   Take 12.5 mg by mouth 3 (three) times daily as needed. For dizziness          . metoprolol tartrate (LOPRESSOR) 25 MG tablet   Oral   Take 25 mg by mouth 2  (two) times daily.         . multivitamin-lutein (OCUVITE-LUTEIN) CAPS   Oral   Take 1 capsule by mouth daily.         . nitrofurantoin, macrocrystal-monohydrate, (MACROBID) 100 MG capsule   Oral   Take 1 capsule (100 mg total) by mouth 2 (two) times daily. X 7 days   14 capsule   0   . nitroGLYCERIN (NITROSTAT) 0.4 MG SL tablet   Sublingual   Place 0.4 mg under the tongue every 5 (five) minutes as needed. For chest pain         . traZODone (DESYREL) 50 MG tablet   Oral   Take 100 mg by mouth at bedtime.         . vitamin B-12 (CYANOCOBALAMIN) 1000 MCG tablet   Oral   Take 1,000 mcg by mouth daily.            BP 168/84  Pulse 74  Temp(Src) 98.9 F (37.2 C) (Oral)  Resp 20  Ht 6' (1.829 m)  Wt 240 lb (108.863 kg)  BMI 32.54 kg/m2  SpO2 97% Physical Exam  Nursing note and vitals reviewed. Constitutional: She is oriented to person, place, and time. She appears well-developed and well-nourished. No distress.  HENT:  Head: Normocephalic and atraumatic.  Eyes: Conjunctivae and EOM are normal.  Cardiovascular: Normal rate and regular rhythm.   Pulmonary/Chest: Effort normal and breath sounds normal. No stridor. No respiratory distress. She has no wheezes.  diminished breath sounds  Abdominal: She exhibits no distension.  Musculoskeletal: She exhibits no edema.  Neurological: She is alert and oriented to person, place, and time. No cranial nerve deficit.  Strength 3/5 BLE. Strength 4/5 BUE.  Skin: Skin is warm and dry.  Psychiatric: She has a normal mood and affect.    ED Course  Procedures (including critical care time) Medications  0.9 %  sodium chloride  infusion (not administered)    Followed by  0.9 %  sodium chloride infusion (not administered)    COORDINATION OF CARE: 8:25 PM- Discussed treatment plan with pt. Pt agrees to plan.    Labs Review Labs Reviewed  CBC WITH DIFFERENTIAL - Abnormal; Notable for the following:    Hemoglobin 11.6 (*)     HCT 34.6 (*)    All other components within normal limits  URINE CULTURE  COMPREHENSIVE METABOLIC PANEL  URINALYSIS, ROUTINE W REFLEX MICROSCOPIC  LACTIC ACID, PLASMA  POCT I-STAT TROPONIN I   Imaging Review Dg Chest Port 1 View  11/27/2013   CLINICAL DATA:  Weakness and dehydration  EXAM: PORTABLE CHEST - 1 VIEW  COMPARISON:  09/18/2013  FINDINGS: Stable cardiomegaly is again seen. A hiatal hernia is noted. The lungs are clear bilaterally. No acute bony abnormality is seen.  IMPRESSION: Stable chest when compared with the prior exam.   Electronically Signed   By: Inez Catalina M.D.   On: 11/27/2013 21:27     MDM  No diagnosis found.  I personally performed the services described in this documentation, which was scribed in my presence. The recorded information has been reviewed and is accurate.   This elderly female with dementia presents with weakness.  Patient is awake alert, though disoriented, she speaks clearly, is in no distress.  Eventually the patient's family as the patient has had no left sided weakness.  CT scan is unrevealing.  Patient's history is consistent with microvascular changes, likely secondary to hypertension.  With her dementia, absence of distress, absence of acute findings, she was stable for discharge with followup with her neurologist and her primary care physician.  Carmin Muskrat, MD 11/28/13 0030

## 2013-11-29 LAB — URINE CULTURE
CULTURE: NO GROWTH
Colony Count: NO GROWTH

## 2014-07-02 ENCOUNTER — Telehealth: Payer: Self-pay | Admitting: Cardiovascular Disease

## 2014-07-02 NOTE — Telephone Encounter (Signed)
PER DAUGHTER, they cant get her to leave the house any longer

## 2014-11-04 DIAGNOSIS — E039 Hypothyroidism, unspecified: Secondary | ICD-10-CM | POA: Diagnosis not present

## 2014-11-04 DIAGNOSIS — I10 Essential (primary) hypertension: Secondary | ICD-10-CM | POA: Diagnosis not present

## 2014-11-04 DIAGNOSIS — F0280 Dementia in other diseases classified elsewhere without behavioral disturbance: Secondary | ICD-10-CM | POA: Diagnosis not present

## 2014-11-04 DIAGNOSIS — L89501 Pressure ulcer of unspecified ankle, stage 1: Secondary | ICD-10-CM | POA: Diagnosis not present

## 2014-11-04 DIAGNOSIS — E785 Hyperlipidemia, unspecified: Secondary | ICD-10-CM | POA: Diagnosis not present

## 2014-11-04 DIAGNOSIS — Z4800 Encounter for change or removal of nonsurgical wound dressing: Secondary | ICD-10-CM | POA: Diagnosis not present

## 2014-11-04 DIAGNOSIS — F328 Other depressive episodes: Secondary | ICD-10-CM | POA: Diagnosis not present

## 2014-11-06 DIAGNOSIS — R32 Unspecified urinary incontinence: Secondary | ICD-10-CM | POA: Diagnosis not present

## 2014-11-06 DIAGNOSIS — G300 Alzheimer's disease with early onset: Secondary | ICD-10-CM | POA: Diagnosis not present

## 2014-11-12 DIAGNOSIS — F0280 Dementia in other diseases classified elsewhere without behavioral disturbance: Secondary | ICD-10-CM | POA: Diagnosis not present

## 2014-11-12 DIAGNOSIS — F328 Other depressive episodes: Secondary | ICD-10-CM | POA: Diagnosis not present

## 2014-11-12 DIAGNOSIS — E785 Hyperlipidemia, unspecified: Secondary | ICD-10-CM | POA: Diagnosis not present

## 2014-11-12 DIAGNOSIS — Z4800 Encounter for change or removal of nonsurgical wound dressing: Secondary | ICD-10-CM | POA: Diagnosis not present

## 2014-11-12 DIAGNOSIS — I10 Essential (primary) hypertension: Secondary | ICD-10-CM | POA: Diagnosis not present

## 2014-11-12 DIAGNOSIS — E039 Hypothyroidism, unspecified: Secondary | ICD-10-CM | POA: Diagnosis not present

## 2014-11-12 DIAGNOSIS — L89501 Pressure ulcer of unspecified ankle, stage 1: Secondary | ICD-10-CM | POA: Diagnosis not present

## 2014-11-27 DIAGNOSIS — I10 Essential (primary) hypertension: Secondary | ICD-10-CM | POA: Diagnosis not present

## 2014-11-27 DIAGNOSIS — F328 Other depressive episodes: Secondary | ICD-10-CM | POA: Diagnosis not present

## 2014-11-27 DIAGNOSIS — F0280 Dementia in other diseases classified elsewhere without behavioral disturbance: Secondary | ICD-10-CM | POA: Diagnosis not present

## 2014-11-27 DIAGNOSIS — E785 Hyperlipidemia, unspecified: Secondary | ICD-10-CM | POA: Diagnosis not present

## 2014-11-27 DIAGNOSIS — E039 Hypothyroidism, unspecified: Secondary | ICD-10-CM | POA: Diagnosis not present

## 2014-12-07 DIAGNOSIS — G300 Alzheimer's disease with early onset: Secondary | ICD-10-CM | POA: Diagnosis not present

## 2014-12-07 DIAGNOSIS — R32 Unspecified urinary incontinence: Secondary | ICD-10-CM | POA: Diagnosis not present

## 2014-12-10 IMAGING — CT CT ABD-PELV W/ CM
2 of 4 series · 16 of 46 positions shown, 18 images · IV contrast (Omnipaque 300)
Comparison: 09/18/2013

CLINICAL DATA: Abdominal pain, history, dementia, chronic kidney
disease, diverticulosis, GERD

EXAM:
CT ABDOMEN AND PELVIS WITH CONTRAST
TECHNIQUE: Multidetector CT imaging of the abdomen and pelvis was performed
using the standard protocol following bolus administration of
intravenous contrast. Sagittal and coronal MPR images reconstructed
from axial data set.
CONTRAST:  50mL OMNIPAQUE IOHEXOL 300 MG/ML SOLN orally, 80mL
OMNIPAQUE IOHEXOL 300 MG/ML SOLN IV

[Series 2: abd_pel_with 5.0 b40f · axial · 0.83mm/px · z∈[-469,-9]mm · 13 of 102 slices shown, 15 images]
[im 5/102  soft-tissue]
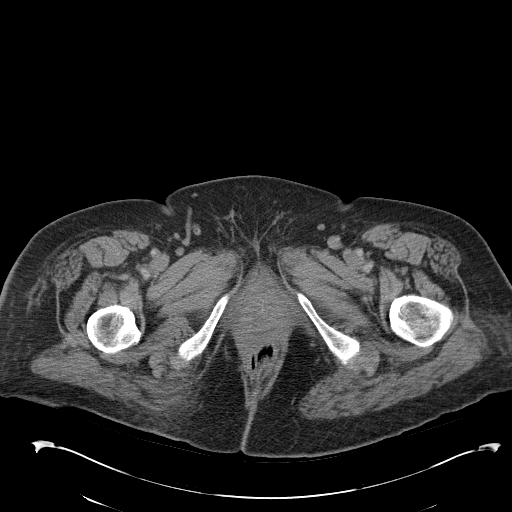
[im 5/102  bone]
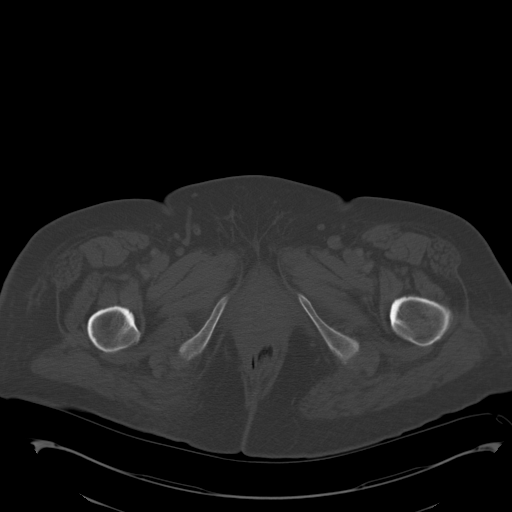
[im 14/102  soft-tissue]
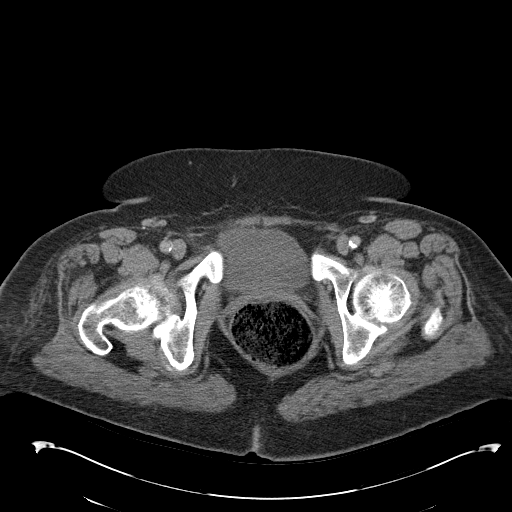
[im 23/102  soft-tissue]
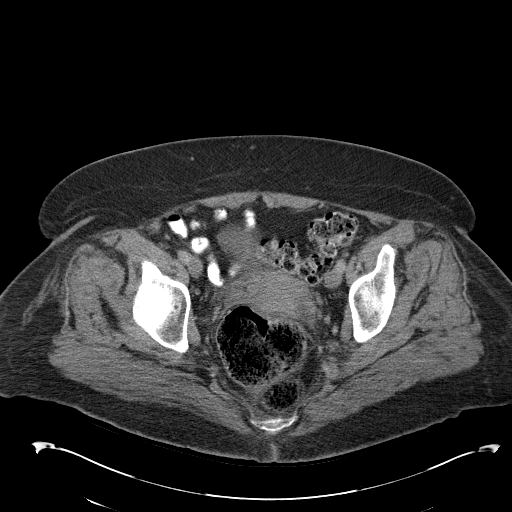
[im 28/102  soft-tissue]
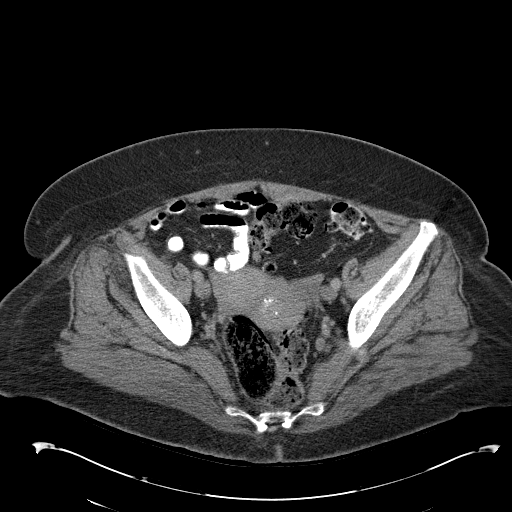
[im 37/102  soft-tissue]
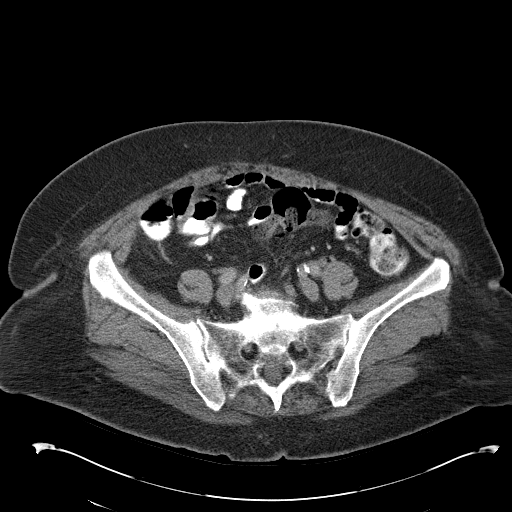
[im 42/102  soft-tissue]
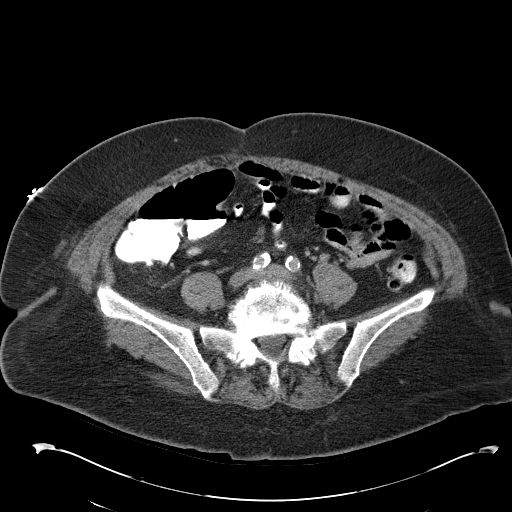
[im 51/102  soft-tissue]
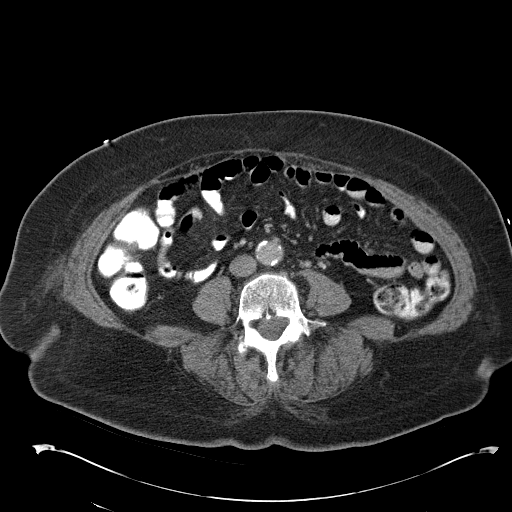
[im 60/102  soft-tissue]
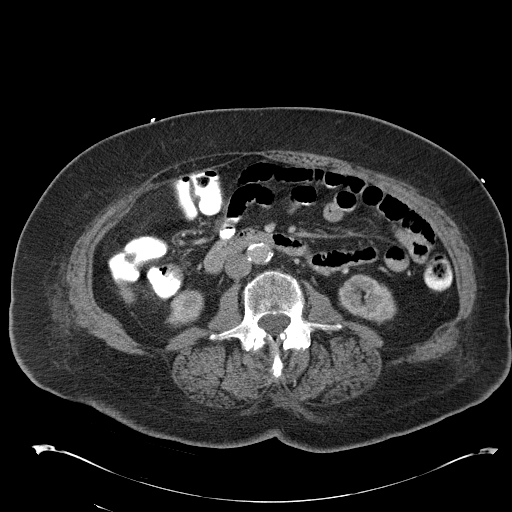
[im 65/102  soft-tissue]
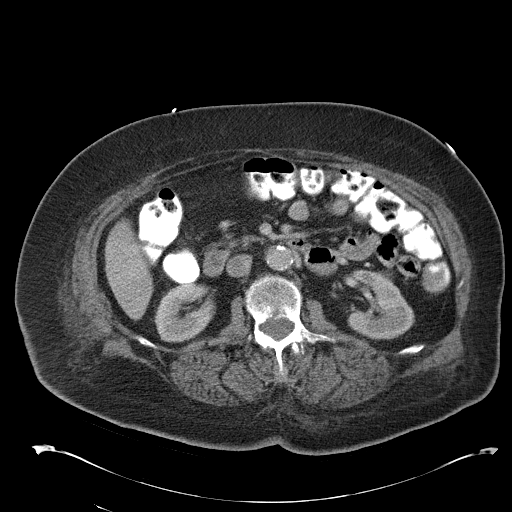
[im 65/102  bone]
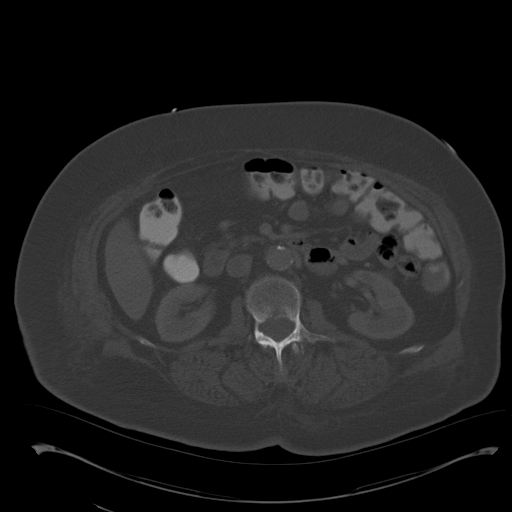
[im 74/102  soft-tissue]
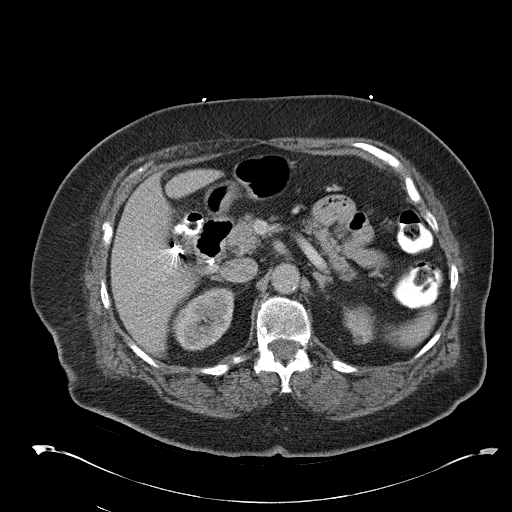
[im 79/102  soft-tissue]
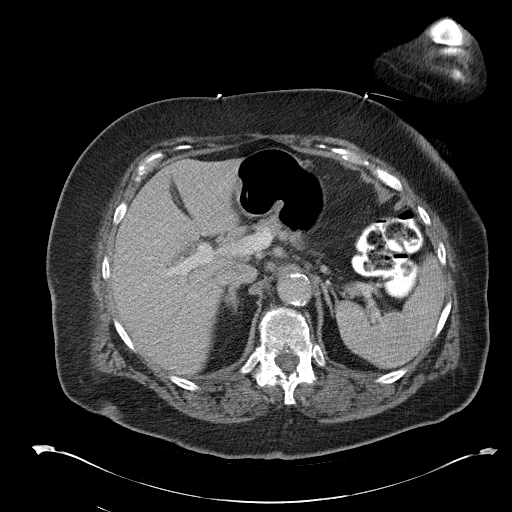
[im 88/102  soft-tissue]
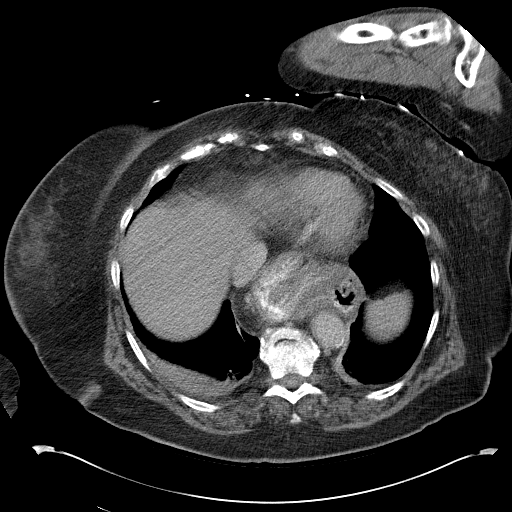
[im 97/102  soft-tissue]
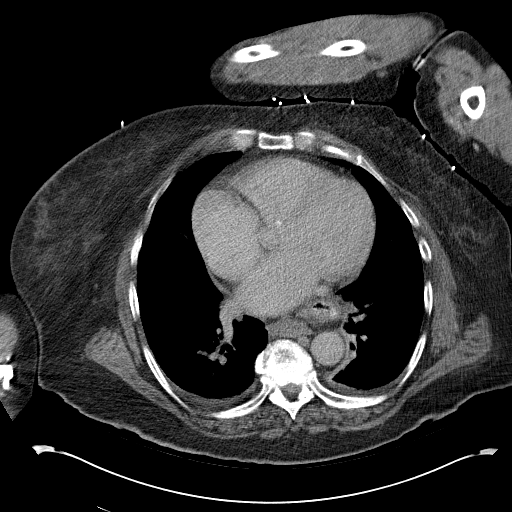

[Series 4: abd_pel_with 3.0 spo · coronal · 0.75mm/px · 3 of 100 slices shown]
[im 34/100  soft-tissue]
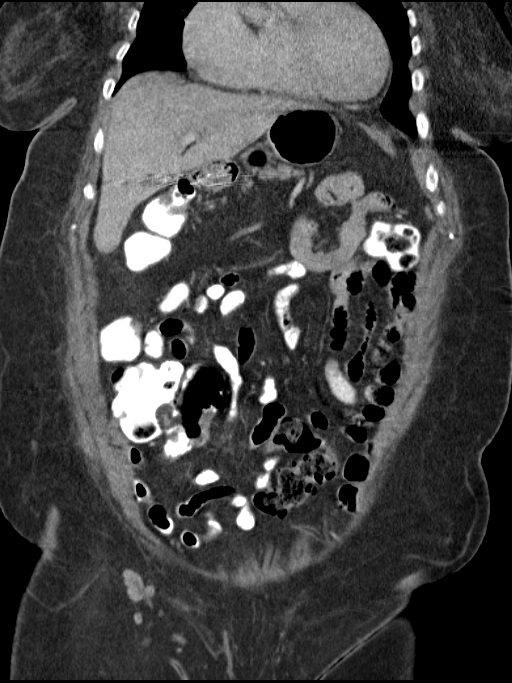
[im 45/100  soft-tissue]
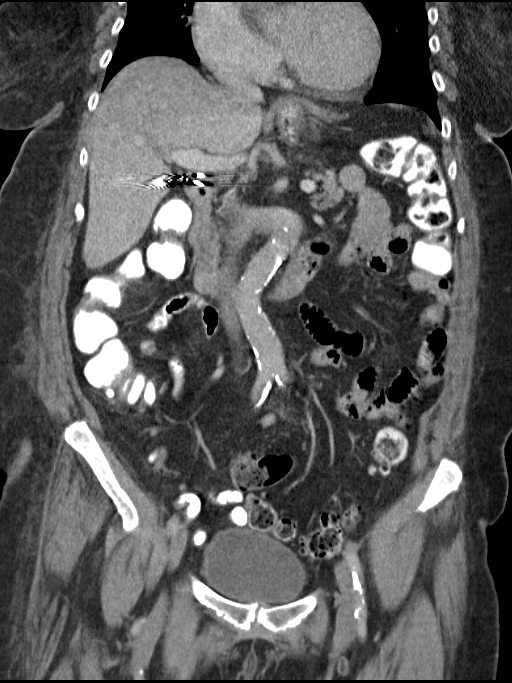
[im 56/100  soft-tissue]
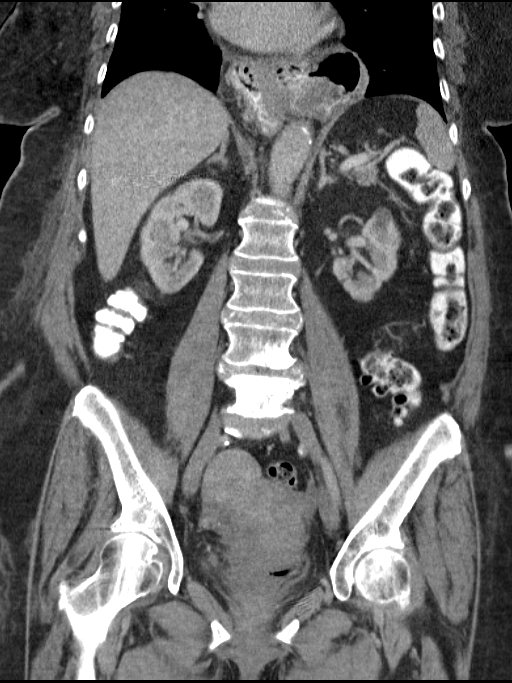

[16 of 46 positions shown; findings below may reference images not displayed]

FINDINGS: Dependent atelectasis at both lung bases.

Tiny bibasilar pleural effusions.

Large hiatal hernia.

Atherosclerotic calcifications aorta and coronary arteries.

Gallbladder surgically absent.

Liver, spleen, pancreas, and adrenal glands normal appearance.

Small bilateral renal cysts.

Nonobstructing calculus upper pole left kidney 9 mm diameter image
33.

Uterine leiomyomata.

No definite hydronephrosis or ureteral dilatation.

Bladder unremarkable.

Scattered pelvic calcifications and phleboliths.

Diverticulosis of distal colon without evidence of diverticulitis.

Stomach and remaining bowel loops otherwise unremarkable.

No mass, adenopathy, free fluid or inflammatory process.

Scattered degenerative disc disease changes of the thoracolumbar
spine.
IMPRESSION: Distal colonic diverticulosis without evidence of diverticulitis.

Nonobstructing left renal calculus.

Uterine leiomyomata.

Large hiatal hernia.

Bibasilar atelectasis and tiny pleural effusions.

## 2014-12-12 DIAGNOSIS — R401 Stupor: Secondary | ICD-10-CM | POA: Diagnosis not present

## 2014-12-12 DIAGNOSIS — R32 Unspecified urinary incontinence: Secondary | ICD-10-CM | POA: Diagnosis not present

## 2014-12-12 DIAGNOSIS — I6789 Other cerebrovascular disease: Secondary | ICD-10-CM | POA: Diagnosis not present

## 2014-12-12 DIAGNOSIS — R918 Other nonspecific abnormal finding of lung field: Secondary | ICD-10-CM | POA: Diagnosis not present

## 2014-12-12 DIAGNOSIS — R4182 Altered mental status, unspecified: Secondary | ICD-10-CM | POA: Diagnosis not present

## 2014-12-12 DIAGNOSIS — K219 Gastro-esophageal reflux disease without esophagitis: Secondary | ICD-10-CM | POA: Diagnosis not present

## 2014-12-12 DIAGNOSIS — F328 Other depressive episodes: Secondary | ICD-10-CM | POA: Diagnosis not present

## 2014-12-12 DIAGNOSIS — Z955 Presence of coronary angioplasty implant and graft: Secondary | ICD-10-CM | POA: Diagnosis not present

## 2014-12-12 DIAGNOSIS — Z8673 Personal history of transient ischemic attack (TIA), and cerebral infarction without residual deficits: Secondary | ICD-10-CM | POA: Diagnosis not present

## 2014-12-12 DIAGNOSIS — G309 Alzheimer's disease, unspecified: Secondary | ICD-10-CM | POA: Diagnosis not present

## 2014-12-12 DIAGNOSIS — R279 Unspecified lack of coordination: Secondary | ICD-10-CM | POA: Diagnosis not present

## 2014-12-12 DIAGNOSIS — M1990 Unspecified osteoarthritis, unspecified site: Secondary | ICD-10-CM | POA: Diagnosis not present

## 2014-12-12 DIAGNOSIS — Z7982 Long term (current) use of aspirin: Secondary | ICD-10-CM | POA: Diagnosis not present

## 2014-12-12 DIAGNOSIS — Z79899 Other long term (current) drug therapy: Secondary | ICD-10-CM | POA: Diagnosis not present

## 2014-12-12 DIAGNOSIS — Z87891 Personal history of nicotine dependence: Secondary | ICD-10-CM | POA: Diagnosis not present

## 2014-12-12 DIAGNOSIS — E785 Hyperlipidemia, unspecified: Secondary | ICD-10-CM | POA: Diagnosis not present

## 2014-12-12 DIAGNOSIS — R159 Full incontinence of feces: Secondary | ICD-10-CM | POA: Diagnosis not present

## 2014-12-12 DIAGNOSIS — I252 Old myocardial infarction: Secondary | ICD-10-CM | POA: Diagnosis not present

## 2014-12-12 DIAGNOSIS — Z7401 Bed confinement status: Secondary | ICD-10-CM | POA: Diagnosis not present

## 2014-12-12 DIAGNOSIS — I10 Essential (primary) hypertension: Secondary | ICD-10-CM | POA: Diagnosis not present

## 2014-12-12 DIAGNOSIS — F0280 Dementia in other diseases classified elsewhere without behavioral disturbance: Secondary | ICD-10-CM | POA: Diagnosis not present

## 2014-12-12 DIAGNOSIS — R531 Weakness: Secondary | ICD-10-CM | POA: Diagnosis not present

## 2014-12-12 DIAGNOSIS — N390 Urinary tract infection, site not specified: Secondary | ICD-10-CM | POA: Diagnosis not present

## 2014-12-12 DIAGNOSIS — I517 Cardiomegaly: Secondary | ICD-10-CM | POA: Diagnosis not present

## 2014-12-12 DIAGNOSIS — E039 Hypothyroidism, unspecified: Secondary | ICD-10-CM | POA: Diagnosis not present

## 2014-12-12 DIAGNOSIS — F4489 Other dissociative and conversion disorders: Secondary | ICD-10-CM | POA: Diagnosis not present

## 2015-01-05 DIAGNOSIS — G300 Alzheimer's disease with early onset: Secondary | ICD-10-CM | POA: Diagnosis not present

## 2015-01-05 DIAGNOSIS — R32 Unspecified urinary incontinence: Secondary | ICD-10-CM | POA: Diagnosis not present

## 2015-01-12 DIAGNOSIS — G301 Alzheimer's disease with late onset: Secondary | ICD-10-CM | POA: Diagnosis not present

## 2015-01-15 DIAGNOSIS — I2510 Atherosclerotic heart disease of native coronary artery without angina pectoris: Secondary | ICD-10-CM | POA: Diagnosis not present

## 2015-01-15 DIAGNOSIS — F17211 Nicotine dependence, cigarettes, in remission: Secondary | ICD-10-CM | POA: Diagnosis not present

## 2015-01-15 DIAGNOSIS — R350 Frequency of micturition: Secondary | ICD-10-CM | POA: Diagnosis not present

## 2015-01-15 DIAGNOSIS — E669 Obesity, unspecified: Secondary | ICD-10-CM | POA: Diagnosis not present

## 2015-01-15 DIAGNOSIS — I10 Essential (primary) hypertension: Secondary | ICD-10-CM | POA: Diagnosis not present

## 2015-01-15 DIAGNOSIS — E038 Other specified hypothyroidism: Secondary | ICD-10-CM | POA: Diagnosis not present

## 2015-01-15 DIAGNOSIS — G301 Alzheimer's disease with late onset: Secondary | ICD-10-CM | POA: Diagnosis not present

## 2015-01-15 DIAGNOSIS — F329 Major depressive disorder, single episode, unspecified: Secondary | ICD-10-CM | POA: Diagnosis not present

## 2015-01-18 DIAGNOSIS — I2510 Atherosclerotic heart disease of native coronary artery without angina pectoris: Secondary | ICD-10-CM | POA: Diagnosis not present

## 2015-01-18 DIAGNOSIS — I10 Essential (primary) hypertension: Secondary | ICD-10-CM | POA: Diagnosis not present

## 2015-01-18 DIAGNOSIS — F17211 Nicotine dependence, cigarettes, in remission: Secondary | ICD-10-CM | POA: Diagnosis not present

## 2015-01-18 DIAGNOSIS — F329 Major depressive disorder, single episode, unspecified: Secondary | ICD-10-CM | POA: Diagnosis not present

## 2015-01-18 DIAGNOSIS — G301 Alzheimer's disease with late onset: Secondary | ICD-10-CM | POA: Diagnosis not present

## 2015-01-18 DIAGNOSIS — E038 Other specified hypothyroidism: Secondary | ICD-10-CM | POA: Diagnosis not present

## 2015-01-18 DIAGNOSIS — R350 Frequency of micturition: Secondary | ICD-10-CM | POA: Diagnosis not present

## 2015-01-18 DIAGNOSIS — E669 Obesity, unspecified: Secondary | ICD-10-CM | POA: Diagnosis not present

## 2015-01-19 DIAGNOSIS — I10 Essential (primary) hypertension: Secondary | ICD-10-CM | POA: Diagnosis not present

## 2015-01-19 DIAGNOSIS — E669 Obesity, unspecified: Secondary | ICD-10-CM | POA: Diagnosis not present

## 2015-01-19 DIAGNOSIS — F329 Major depressive disorder, single episode, unspecified: Secondary | ICD-10-CM | POA: Diagnosis not present

## 2015-01-19 DIAGNOSIS — F17211 Nicotine dependence, cigarettes, in remission: Secondary | ICD-10-CM | POA: Diagnosis not present

## 2015-01-19 DIAGNOSIS — R350 Frequency of micturition: Secondary | ICD-10-CM | POA: Diagnosis not present

## 2015-01-19 DIAGNOSIS — I2510 Atherosclerotic heart disease of native coronary artery without angina pectoris: Secondary | ICD-10-CM | POA: Diagnosis not present

## 2015-01-19 DIAGNOSIS — E038 Other specified hypothyroidism: Secondary | ICD-10-CM | POA: Diagnosis not present

## 2015-01-19 DIAGNOSIS — G301 Alzheimer's disease with late onset: Secondary | ICD-10-CM | POA: Diagnosis not present

## 2015-01-20 DIAGNOSIS — I10 Essential (primary) hypertension: Secondary | ICD-10-CM | POA: Diagnosis not present

## 2015-01-20 DIAGNOSIS — I2510 Atherosclerotic heart disease of native coronary artery without angina pectoris: Secondary | ICD-10-CM | POA: Diagnosis not present

## 2015-01-20 DIAGNOSIS — F329 Major depressive disorder, single episode, unspecified: Secondary | ICD-10-CM | POA: Diagnosis not present

## 2015-01-20 DIAGNOSIS — R350 Frequency of micturition: Secondary | ICD-10-CM | POA: Diagnosis not present

## 2015-01-20 DIAGNOSIS — E038 Other specified hypothyroidism: Secondary | ICD-10-CM | POA: Diagnosis not present

## 2015-01-20 DIAGNOSIS — G301 Alzheimer's disease with late onset: Secondary | ICD-10-CM | POA: Diagnosis not present

## 2015-01-20 DIAGNOSIS — F17211 Nicotine dependence, cigarettes, in remission: Secondary | ICD-10-CM | POA: Diagnosis not present

## 2015-01-20 DIAGNOSIS — E669 Obesity, unspecified: Secondary | ICD-10-CM | POA: Diagnosis not present

## 2015-01-21 DIAGNOSIS — R350 Frequency of micturition: Secondary | ICD-10-CM | POA: Diagnosis not present

## 2015-01-21 DIAGNOSIS — G301 Alzheimer's disease with late onset: Secondary | ICD-10-CM | POA: Diagnosis not present

## 2015-01-21 DIAGNOSIS — F17211 Nicotine dependence, cigarettes, in remission: Secondary | ICD-10-CM | POA: Diagnosis not present

## 2015-01-21 DIAGNOSIS — E669 Obesity, unspecified: Secondary | ICD-10-CM | POA: Diagnosis not present

## 2015-01-21 DIAGNOSIS — E038 Other specified hypothyroidism: Secondary | ICD-10-CM | POA: Diagnosis not present

## 2015-01-21 DIAGNOSIS — I10 Essential (primary) hypertension: Secondary | ICD-10-CM | POA: Diagnosis not present

## 2015-01-21 DIAGNOSIS — I2510 Atherosclerotic heart disease of native coronary artery without angina pectoris: Secondary | ICD-10-CM | POA: Diagnosis not present

## 2015-01-21 DIAGNOSIS — F329 Major depressive disorder, single episode, unspecified: Secondary | ICD-10-CM | POA: Diagnosis not present

## 2015-01-26 DIAGNOSIS — I2510 Atherosclerotic heart disease of native coronary artery without angina pectoris: Secondary | ICD-10-CM | POA: Diagnosis not present

## 2015-01-26 DIAGNOSIS — E669 Obesity, unspecified: Secondary | ICD-10-CM | POA: Diagnosis not present

## 2015-01-26 DIAGNOSIS — G301 Alzheimer's disease with late onset: Secondary | ICD-10-CM | POA: Diagnosis not present

## 2015-01-26 DIAGNOSIS — F329 Major depressive disorder, single episode, unspecified: Secondary | ICD-10-CM | POA: Diagnosis not present

## 2015-01-26 DIAGNOSIS — E038 Other specified hypothyroidism: Secondary | ICD-10-CM | POA: Diagnosis not present

## 2015-01-26 DIAGNOSIS — F17211 Nicotine dependence, cigarettes, in remission: Secondary | ICD-10-CM | POA: Diagnosis not present

## 2015-01-26 DIAGNOSIS — I10 Essential (primary) hypertension: Secondary | ICD-10-CM | POA: Diagnosis not present

## 2015-01-26 DIAGNOSIS — R350 Frequency of micturition: Secondary | ICD-10-CM | POA: Diagnosis not present

## 2015-01-27 DIAGNOSIS — I10 Essential (primary) hypertension: Secondary | ICD-10-CM | POA: Diagnosis not present

## 2015-01-27 DIAGNOSIS — R350 Frequency of micturition: Secondary | ICD-10-CM | POA: Diagnosis not present

## 2015-01-27 DIAGNOSIS — F329 Major depressive disorder, single episode, unspecified: Secondary | ICD-10-CM | POA: Diagnosis not present

## 2015-01-27 DIAGNOSIS — E038 Other specified hypothyroidism: Secondary | ICD-10-CM | POA: Diagnosis not present

## 2015-01-27 DIAGNOSIS — M6281 Muscle weakness (generalized): Secondary | ICD-10-CM | POA: Diagnosis not present

## 2015-01-27 DIAGNOSIS — I2510 Atherosclerotic heart disease of native coronary artery without angina pectoris: Secondary | ICD-10-CM | POA: Diagnosis not present

## 2015-01-27 DIAGNOSIS — E669 Obesity, unspecified: Secondary | ICD-10-CM | POA: Diagnosis not present

## 2015-01-27 DIAGNOSIS — F17211 Nicotine dependence, cigarettes, in remission: Secondary | ICD-10-CM | POA: Diagnosis not present

## 2015-01-27 DIAGNOSIS — G301 Alzheimer's disease with late onset: Secondary | ICD-10-CM | POA: Diagnosis not present

## 2015-01-28 DIAGNOSIS — G301 Alzheimer's disease with late onset: Secondary | ICD-10-CM | POA: Diagnosis not present

## 2015-01-28 DIAGNOSIS — E669 Obesity, unspecified: Secondary | ICD-10-CM | POA: Diagnosis not present

## 2015-01-28 DIAGNOSIS — F17211 Nicotine dependence, cigarettes, in remission: Secondary | ICD-10-CM | POA: Diagnosis not present

## 2015-01-28 DIAGNOSIS — I2510 Atherosclerotic heart disease of native coronary artery without angina pectoris: Secondary | ICD-10-CM | POA: Diagnosis not present

## 2015-01-28 DIAGNOSIS — E038 Other specified hypothyroidism: Secondary | ICD-10-CM | POA: Diagnosis not present

## 2015-01-28 DIAGNOSIS — F329 Major depressive disorder, single episode, unspecified: Secondary | ICD-10-CM | POA: Diagnosis not present

## 2015-01-28 DIAGNOSIS — I10 Essential (primary) hypertension: Secondary | ICD-10-CM | POA: Diagnosis not present

## 2015-01-28 DIAGNOSIS — R350 Frequency of micturition: Secondary | ICD-10-CM | POA: Diagnosis not present

## 2015-02-01 DIAGNOSIS — N390 Urinary tract infection, site not specified: Secondary | ICD-10-CM | POA: Diagnosis not present

## 2015-02-01 DIAGNOSIS — F329 Major depressive disorder, single episode, unspecified: Secondary | ICD-10-CM | POA: Diagnosis not present

## 2015-02-01 DIAGNOSIS — F17211 Nicotine dependence, cigarettes, in remission: Secondary | ICD-10-CM | POA: Diagnosis not present

## 2015-02-01 DIAGNOSIS — E669 Obesity, unspecified: Secondary | ICD-10-CM | POA: Diagnosis not present

## 2015-02-01 DIAGNOSIS — I10 Essential (primary) hypertension: Secondary | ICD-10-CM | POA: Diagnosis not present

## 2015-02-01 DIAGNOSIS — G301 Alzheimer's disease with late onset: Secondary | ICD-10-CM | POA: Diagnosis not present

## 2015-02-01 DIAGNOSIS — R350 Frequency of micturition: Secondary | ICD-10-CM | POA: Diagnosis not present

## 2015-02-01 DIAGNOSIS — E038 Other specified hypothyroidism: Secondary | ICD-10-CM | POA: Diagnosis not present

## 2015-02-01 DIAGNOSIS — I2510 Atherosclerotic heart disease of native coronary artery without angina pectoris: Secondary | ICD-10-CM | POA: Diagnosis not present

## 2015-02-02 DIAGNOSIS — I10 Essential (primary) hypertension: Secondary | ICD-10-CM | POA: Diagnosis not present

## 2015-02-05 DIAGNOSIS — E669 Obesity, unspecified: Secondary | ICD-10-CM | POA: Diagnosis not present

## 2015-02-05 DIAGNOSIS — F17211 Nicotine dependence, cigarettes, in remission: Secondary | ICD-10-CM | POA: Diagnosis not present

## 2015-02-05 DIAGNOSIS — G301 Alzheimer's disease with late onset: Secondary | ICD-10-CM | POA: Diagnosis not present

## 2015-02-05 DIAGNOSIS — I10 Essential (primary) hypertension: Secondary | ICD-10-CM | POA: Diagnosis not present

## 2015-02-05 DIAGNOSIS — E038 Other specified hypothyroidism: Secondary | ICD-10-CM | POA: Diagnosis not present

## 2015-02-05 DIAGNOSIS — R32 Unspecified urinary incontinence: Secondary | ICD-10-CM | POA: Diagnosis not present

## 2015-02-05 DIAGNOSIS — F329 Major depressive disorder, single episode, unspecified: Secondary | ICD-10-CM | POA: Diagnosis not present

## 2015-02-05 DIAGNOSIS — I2510 Atherosclerotic heart disease of native coronary artery without angina pectoris: Secondary | ICD-10-CM | POA: Diagnosis not present

## 2015-02-05 DIAGNOSIS — R350 Frequency of micturition: Secondary | ICD-10-CM | POA: Diagnosis not present

## 2015-02-05 DIAGNOSIS — G300 Alzheimer's disease with early onset: Secondary | ICD-10-CM | POA: Diagnosis not present

## 2015-02-09 DIAGNOSIS — F329 Major depressive disorder, single episode, unspecified: Secondary | ICD-10-CM | POA: Diagnosis not present

## 2015-02-09 DIAGNOSIS — I2510 Atherosclerotic heart disease of native coronary artery without angina pectoris: Secondary | ICD-10-CM | POA: Diagnosis not present

## 2015-02-09 DIAGNOSIS — F17211 Nicotine dependence, cigarettes, in remission: Secondary | ICD-10-CM | POA: Diagnosis not present

## 2015-02-09 DIAGNOSIS — E669 Obesity, unspecified: Secondary | ICD-10-CM | POA: Diagnosis not present

## 2015-02-09 DIAGNOSIS — R350 Frequency of micturition: Secondary | ICD-10-CM | POA: Diagnosis not present

## 2015-02-09 DIAGNOSIS — E038 Other specified hypothyroidism: Secondary | ICD-10-CM | POA: Diagnosis not present

## 2015-02-09 DIAGNOSIS — G301 Alzheimer's disease with late onset: Secondary | ICD-10-CM | POA: Diagnosis not present

## 2015-02-09 DIAGNOSIS — I10 Essential (primary) hypertension: Secondary | ICD-10-CM | POA: Diagnosis not present

## 2015-02-15 DIAGNOSIS — I2510 Atherosclerotic heart disease of native coronary artery without angina pectoris: Secondary | ICD-10-CM | POA: Diagnosis not present

## 2015-02-15 DIAGNOSIS — R350 Frequency of micturition: Secondary | ICD-10-CM | POA: Diagnosis not present

## 2015-02-15 DIAGNOSIS — F17211 Nicotine dependence, cigarettes, in remission: Secondary | ICD-10-CM | POA: Diagnosis not present

## 2015-02-15 DIAGNOSIS — I10 Essential (primary) hypertension: Secondary | ICD-10-CM | POA: Diagnosis not present

## 2015-02-15 DIAGNOSIS — E038 Other specified hypothyroidism: Secondary | ICD-10-CM | POA: Diagnosis not present

## 2015-02-15 DIAGNOSIS — E669 Obesity, unspecified: Secondary | ICD-10-CM | POA: Diagnosis not present

## 2015-02-15 DIAGNOSIS — F329 Major depressive disorder, single episode, unspecified: Secondary | ICD-10-CM | POA: Diagnosis not present

## 2015-02-15 DIAGNOSIS — G301 Alzheimer's disease with late onset: Secondary | ICD-10-CM | POA: Diagnosis not present

## 2015-02-17 DIAGNOSIS — R930 Abnormal findings on diagnostic imaging of skull and head, not elsewhere classified: Secondary | ICD-10-CM | POA: Diagnosis not present

## 2015-02-17 DIAGNOSIS — I700 Atherosclerosis of aorta: Secondary | ICD-10-CM | POA: Diagnosis not present

## 2015-02-17 DIAGNOSIS — R4182 Altered mental status, unspecified: Secondary | ICD-10-CM | POA: Diagnosis not present

## 2015-02-17 DIAGNOSIS — R40243 Glasgow coma scale score 3-8: Secondary | ICD-10-CM | POA: Diagnosis not present

## 2015-02-17 DIAGNOSIS — J189 Pneumonia, unspecified organism: Secondary | ICD-10-CM | POA: Diagnosis not present

## 2015-02-17 DIAGNOSIS — R918 Other nonspecific abnormal finding of lung field: Secondary | ICD-10-CM | POA: Diagnosis not present

## 2015-02-18 DIAGNOSIS — I700 Atherosclerosis of aorta: Secondary | ICD-10-CM | POA: Diagnosis not present

## 2015-02-18 DIAGNOSIS — I272 Other secondary pulmonary hypertension: Secondary | ICD-10-CM | POA: Diagnosis not present

## 2015-02-18 DIAGNOSIS — F419 Anxiety disorder, unspecified: Secondary | ICD-10-CM | POA: Diagnosis not present

## 2015-02-18 DIAGNOSIS — I69398 Other sequelae of cerebral infarction: Secondary | ICD-10-CM | POA: Diagnosis not present

## 2015-02-18 DIAGNOSIS — I2510 Atherosclerotic heart disease of native coronary artery without angina pectoris: Secondary | ICD-10-CM | POA: Diagnosis not present

## 2015-02-18 DIAGNOSIS — Z955 Presence of coronary angioplasty implant and graft: Secondary | ICD-10-CM | POA: Diagnosis not present

## 2015-02-18 DIAGNOSIS — J189 Pneumonia, unspecified organism: Secondary | ICD-10-CM | POA: Diagnosis not present

## 2015-02-18 DIAGNOSIS — F0280 Dementia in other diseases classified elsewhere without behavioral disturbance: Secondary | ICD-10-CM | POA: Diagnosis not present

## 2015-02-18 DIAGNOSIS — Z743 Need for continuous supervision: Secondary | ICD-10-CM | POA: Diagnosis not present

## 2015-02-18 DIAGNOSIS — Z91030 Bee allergy status: Secondary | ICD-10-CM | POA: Diagnosis not present

## 2015-02-18 DIAGNOSIS — R569 Unspecified convulsions: Secondary | ICD-10-CM | POA: Diagnosis not present

## 2015-02-18 DIAGNOSIS — G309 Alzheimer's disease, unspecified: Secondary | ICD-10-CM | POA: Diagnosis not present

## 2015-02-18 DIAGNOSIS — J159 Unspecified bacterial pneumonia: Secondary | ICD-10-CM | POA: Diagnosis not present

## 2015-02-18 DIAGNOSIS — D638 Anemia in other chronic diseases classified elsewhere: Secondary | ICD-10-CM | POA: Diagnosis not present

## 2015-02-18 DIAGNOSIS — Z8249 Family history of ischemic heart disease and other diseases of the circulatory system: Secondary | ICD-10-CM | POA: Diagnosis not present

## 2015-02-18 DIAGNOSIS — I340 Nonrheumatic mitral (valve) insufficiency: Secondary | ICD-10-CM | POA: Diagnosis not present

## 2015-02-18 DIAGNOSIS — R404 Transient alteration of awareness: Secondary | ICD-10-CM | POA: Diagnosis not present

## 2015-02-18 DIAGNOSIS — E780 Pure hypercholesterolemia: Secondary | ICD-10-CM | POA: Diagnosis not present

## 2015-02-18 DIAGNOSIS — D696 Thrombocytopenia, unspecified: Secondary | ICD-10-CM | POA: Diagnosis not present

## 2015-02-18 DIAGNOSIS — R918 Other nonspecific abnormal finding of lung field: Secondary | ICD-10-CM | POA: Diagnosis not present

## 2015-02-18 DIAGNOSIS — R4182 Altered mental status, unspecified: Secondary | ICD-10-CM | POA: Diagnosis not present

## 2015-02-18 DIAGNOSIS — G4700 Insomnia, unspecified: Secondary | ICD-10-CM | POA: Diagnosis not present

## 2015-02-18 DIAGNOSIS — Z87440 Personal history of urinary (tract) infections: Secondary | ICD-10-CM | POA: Diagnosis not present

## 2015-02-18 DIAGNOSIS — I69392 Facial weakness following cerebral infarction: Secondary | ICD-10-CM | POA: Diagnosis not present

## 2015-02-18 DIAGNOSIS — J45909 Unspecified asthma, uncomplicated: Secondary | ICD-10-CM | POA: Diagnosis not present

## 2015-02-18 DIAGNOSIS — J690 Pneumonitis due to inhalation of food and vomit: Secondary | ICD-10-CM | POA: Diagnosis not present

## 2015-02-18 DIAGNOSIS — I252 Old myocardial infarction: Secondary | ICD-10-CM | POA: Diagnosis not present

## 2015-02-18 DIAGNOSIS — Z6826 Body mass index (BMI) 26.0-26.9, adult: Secondary | ICD-10-CM | POA: Diagnosis not present

## 2015-02-18 DIAGNOSIS — I10 Essential (primary) hypertension: Secondary | ICD-10-CM | POA: Diagnosis not present

## 2015-02-18 DIAGNOSIS — R930 Abnormal findings on diagnostic imaging of skull and head, not elsewhere classified: Secondary | ICD-10-CM | POA: Diagnosis not present

## 2015-02-18 DIAGNOSIS — K219 Gastro-esophageal reflux disease without esophagitis: Secondary | ICD-10-CM | POA: Diagnosis not present

## 2015-02-24 DIAGNOSIS — F17211 Nicotine dependence, cigarettes, in remission: Secondary | ICD-10-CM | POA: Diagnosis not present

## 2015-02-24 DIAGNOSIS — R350 Frequency of micturition: Secondary | ICD-10-CM | POA: Diagnosis not present

## 2015-02-24 DIAGNOSIS — I10 Essential (primary) hypertension: Secondary | ICD-10-CM | POA: Diagnosis not present

## 2015-02-24 DIAGNOSIS — F329 Major depressive disorder, single episode, unspecified: Secondary | ICD-10-CM | POA: Diagnosis not present

## 2015-02-24 DIAGNOSIS — E669 Obesity, unspecified: Secondary | ICD-10-CM | POA: Diagnosis not present

## 2015-02-24 DIAGNOSIS — I2510 Atherosclerotic heart disease of native coronary artery without angina pectoris: Secondary | ICD-10-CM | POA: Diagnosis not present

## 2015-02-24 DIAGNOSIS — E038 Other specified hypothyroidism: Secondary | ICD-10-CM | POA: Diagnosis not present

## 2015-02-24 DIAGNOSIS — G301 Alzheimer's disease with late onset: Secondary | ICD-10-CM | POA: Diagnosis not present

## 2015-02-27 DIAGNOSIS — M6281 Muscle weakness (generalized): Secondary | ICD-10-CM | POA: Diagnosis not present

## 2015-02-27 DIAGNOSIS — E669 Obesity, unspecified: Secondary | ICD-10-CM | POA: Diagnosis not present

## 2015-03-02 DIAGNOSIS — I2510 Atherosclerotic heart disease of native coronary artery without angina pectoris: Secondary | ICD-10-CM | POA: Diagnosis not present

## 2015-03-02 DIAGNOSIS — F329 Major depressive disorder, single episode, unspecified: Secondary | ICD-10-CM | POA: Diagnosis not present

## 2015-03-02 DIAGNOSIS — E669 Obesity, unspecified: Secondary | ICD-10-CM | POA: Diagnosis not present

## 2015-03-02 DIAGNOSIS — F17211 Nicotine dependence, cigarettes, in remission: Secondary | ICD-10-CM | POA: Diagnosis not present

## 2015-03-02 DIAGNOSIS — I10 Essential (primary) hypertension: Secondary | ICD-10-CM | POA: Diagnosis not present

## 2015-03-02 DIAGNOSIS — R350 Frequency of micturition: Secondary | ICD-10-CM | POA: Diagnosis not present

## 2015-03-02 DIAGNOSIS — G301 Alzheimer's disease with late onset: Secondary | ICD-10-CM | POA: Diagnosis not present

## 2015-03-02 DIAGNOSIS — E038 Other specified hypothyroidism: Secondary | ICD-10-CM | POA: Diagnosis not present

## 2015-03-04 DIAGNOSIS — E038 Other specified hypothyroidism: Secondary | ICD-10-CM | POA: Diagnosis not present

## 2015-03-04 DIAGNOSIS — G301 Alzheimer's disease with late onset: Secondary | ICD-10-CM | POA: Diagnosis not present

## 2015-03-04 DIAGNOSIS — I2510 Atherosclerotic heart disease of native coronary artery without angina pectoris: Secondary | ICD-10-CM | POA: Diagnosis not present

## 2015-03-04 DIAGNOSIS — F17211 Nicotine dependence, cigarettes, in remission: Secondary | ICD-10-CM | POA: Diagnosis not present

## 2015-03-04 DIAGNOSIS — F329 Major depressive disorder, single episode, unspecified: Secondary | ICD-10-CM | POA: Diagnosis not present

## 2015-03-04 DIAGNOSIS — I10 Essential (primary) hypertension: Secondary | ICD-10-CM | POA: Diagnosis not present

## 2015-03-04 DIAGNOSIS — R350 Frequency of micturition: Secondary | ICD-10-CM | POA: Diagnosis not present

## 2015-03-04 DIAGNOSIS — E669 Obesity, unspecified: Secondary | ICD-10-CM | POA: Diagnosis not present

## 2015-03-05 DIAGNOSIS — R32 Unspecified urinary incontinence: Secondary | ICD-10-CM | POA: Diagnosis not present

## 2015-03-05 DIAGNOSIS — G300 Alzheimer's disease with early onset: Secondary | ICD-10-CM | POA: Diagnosis not present

## 2015-03-12 DIAGNOSIS — F329 Major depressive disorder, single episode, unspecified: Secondary | ICD-10-CM | POA: Diagnosis not present

## 2015-03-12 DIAGNOSIS — E669 Obesity, unspecified: Secondary | ICD-10-CM | POA: Diagnosis not present

## 2015-03-12 DIAGNOSIS — F17211 Nicotine dependence, cigarettes, in remission: Secondary | ICD-10-CM | POA: Diagnosis not present

## 2015-03-12 DIAGNOSIS — R350 Frequency of micturition: Secondary | ICD-10-CM | POA: Diagnosis not present

## 2015-03-12 DIAGNOSIS — I2510 Atherosclerotic heart disease of native coronary artery without angina pectoris: Secondary | ICD-10-CM | POA: Diagnosis not present

## 2015-03-12 DIAGNOSIS — I10 Essential (primary) hypertension: Secondary | ICD-10-CM | POA: Diagnosis not present

## 2015-03-12 DIAGNOSIS — G301 Alzheimer's disease with late onset: Secondary | ICD-10-CM | POA: Diagnosis not present

## 2015-03-12 DIAGNOSIS — E038 Other specified hypothyroidism: Secondary | ICD-10-CM | POA: Diagnosis not present

## 2015-03-19 DIAGNOSIS — I10 Essential (primary) hypertension: Secondary | ICD-10-CM | POA: Diagnosis not present

## 2015-03-19 DIAGNOSIS — I2510 Atherosclerotic heart disease of native coronary artery without angina pectoris: Secondary | ICD-10-CM | POA: Diagnosis not present

## 2015-03-19 DIAGNOSIS — F329 Major depressive disorder, single episode, unspecified: Secondary | ICD-10-CM | POA: Diagnosis not present

## 2015-03-19 DIAGNOSIS — R350 Frequency of micturition: Secondary | ICD-10-CM | POA: Diagnosis not present

## 2015-03-19 DIAGNOSIS — E669 Obesity, unspecified: Secondary | ICD-10-CM | POA: Diagnosis not present

## 2015-03-19 DIAGNOSIS — F17211 Nicotine dependence, cigarettes, in remission: Secondary | ICD-10-CM | POA: Diagnosis not present

## 2015-03-19 DIAGNOSIS — G301 Alzheimer's disease with late onset: Secondary | ICD-10-CM | POA: Diagnosis not present

## 2015-03-19 DIAGNOSIS — E038 Other specified hypothyroidism: Secondary | ICD-10-CM | POA: Diagnosis not present

## 2015-03-23 DIAGNOSIS — F17211 Nicotine dependence, cigarettes, in remission: Secondary | ICD-10-CM | POA: Diagnosis not present

## 2015-03-23 DIAGNOSIS — E038 Other specified hypothyroidism: Secondary | ICD-10-CM | POA: Diagnosis not present

## 2015-03-23 DIAGNOSIS — F329 Major depressive disorder, single episode, unspecified: Secondary | ICD-10-CM | POA: Diagnosis not present

## 2015-03-23 DIAGNOSIS — R350 Frequency of micturition: Secondary | ICD-10-CM | POA: Diagnosis not present

## 2015-03-23 DIAGNOSIS — I10 Essential (primary) hypertension: Secondary | ICD-10-CM | POA: Diagnosis not present

## 2015-03-23 DIAGNOSIS — G301 Alzheimer's disease with late onset: Secondary | ICD-10-CM | POA: Diagnosis not present

## 2015-03-23 DIAGNOSIS — E669 Obesity, unspecified: Secondary | ICD-10-CM | POA: Diagnosis not present

## 2015-03-23 DIAGNOSIS — I2510 Atherosclerotic heart disease of native coronary artery without angina pectoris: Secondary | ICD-10-CM | POA: Diagnosis not present

## 2015-03-29 DIAGNOSIS — M6281 Muscle weakness (generalized): Secondary | ICD-10-CM | POA: Diagnosis not present

## 2015-03-29 DIAGNOSIS — E669 Obesity, unspecified: Secondary | ICD-10-CM | POA: Diagnosis not present

## 2015-03-31 DIAGNOSIS — G301 Alzheimer's disease with late onset: Secondary | ICD-10-CM | POA: Diagnosis not present

## 2015-03-31 DIAGNOSIS — F17211 Nicotine dependence, cigarettes, in remission: Secondary | ICD-10-CM | POA: Diagnosis not present

## 2015-03-31 DIAGNOSIS — E669 Obesity, unspecified: Secondary | ICD-10-CM | POA: Diagnosis not present

## 2015-03-31 DIAGNOSIS — I2510 Atherosclerotic heart disease of native coronary artery without angina pectoris: Secondary | ICD-10-CM | POA: Diagnosis not present

## 2015-03-31 DIAGNOSIS — F329 Major depressive disorder, single episode, unspecified: Secondary | ICD-10-CM | POA: Diagnosis not present

## 2015-03-31 DIAGNOSIS — R350 Frequency of micturition: Secondary | ICD-10-CM | POA: Diagnosis not present

## 2015-03-31 DIAGNOSIS — E038 Other specified hypothyroidism: Secondary | ICD-10-CM | POA: Diagnosis not present

## 2015-03-31 DIAGNOSIS — I10 Essential (primary) hypertension: Secondary | ICD-10-CM | POA: Diagnosis not present

## 2015-04-07 DIAGNOSIS — G301 Alzheimer's disease with late onset: Secondary | ICD-10-CM | POA: Diagnosis not present

## 2015-04-07 DIAGNOSIS — G300 Alzheimer's disease with early onset: Secondary | ICD-10-CM | POA: Diagnosis not present

## 2015-04-07 DIAGNOSIS — F17211 Nicotine dependence, cigarettes, in remission: Secondary | ICD-10-CM | POA: Diagnosis not present

## 2015-04-07 DIAGNOSIS — R32 Unspecified urinary incontinence: Secondary | ICD-10-CM | POA: Diagnosis not present

## 2015-04-07 DIAGNOSIS — E669 Obesity, unspecified: Secondary | ICD-10-CM | POA: Diagnosis not present

## 2015-04-07 DIAGNOSIS — F329 Major depressive disorder, single episode, unspecified: Secondary | ICD-10-CM | POA: Diagnosis not present

## 2015-04-07 DIAGNOSIS — K5710 Diverticulosis of small intestine without perforation or abscess without bleeding: Secondary | ICD-10-CM | POA: Diagnosis not present

## 2015-04-07 DIAGNOSIS — I10 Essential (primary) hypertension: Secondary | ICD-10-CM | POA: Diagnosis not present

## 2015-04-07 DIAGNOSIS — I2510 Atherosclerotic heart disease of native coronary artery without angina pectoris: Secondary | ICD-10-CM | POA: Diagnosis not present

## 2015-04-07 DIAGNOSIS — E038 Other specified hypothyroidism: Secondary | ICD-10-CM | POA: Diagnosis not present

## 2015-04-07 DIAGNOSIS — R350 Frequency of micturition: Secondary | ICD-10-CM | POA: Diagnosis not present

## 2015-04-13 DIAGNOSIS — R350 Frequency of micturition: Secondary | ICD-10-CM | POA: Diagnosis not present

## 2015-04-13 DIAGNOSIS — I2510 Atherosclerotic heart disease of native coronary artery without angina pectoris: Secondary | ICD-10-CM | POA: Diagnosis not present

## 2015-04-13 DIAGNOSIS — F17211 Nicotine dependence, cigarettes, in remission: Secondary | ICD-10-CM | POA: Diagnosis not present

## 2015-04-13 DIAGNOSIS — G301 Alzheimer's disease with late onset: Secondary | ICD-10-CM | POA: Diagnosis not present

## 2015-04-13 DIAGNOSIS — E669 Obesity, unspecified: Secondary | ICD-10-CM | POA: Diagnosis not present

## 2015-04-13 DIAGNOSIS — F329 Major depressive disorder, single episode, unspecified: Secondary | ICD-10-CM | POA: Diagnosis not present

## 2015-04-13 DIAGNOSIS — E038 Other specified hypothyroidism: Secondary | ICD-10-CM | POA: Diagnosis not present

## 2015-04-13 DIAGNOSIS — I10 Essential (primary) hypertension: Secondary | ICD-10-CM | POA: Diagnosis not present

## 2015-04-22 DIAGNOSIS — F17211 Nicotine dependence, cigarettes, in remission: Secondary | ICD-10-CM | POA: Diagnosis not present

## 2015-04-22 DIAGNOSIS — I2510 Atherosclerotic heart disease of native coronary artery without angina pectoris: Secondary | ICD-10-CM | POA: Diagnosis not present

## 2015-04-22 DIAGNOSIS — I10 Essential (primary) hypertension: Secondary | ICD-10-CM | POA: Diagnosis not present

## 2015-04-22 DIAGNOSIS — E669 Obesity, unspecified: Secondary | ICD-10-CM | POA: Diagnosis not present

## 2015-04-22 DIAGNOSIS — G301 Alzheimer's disease with late onset: Secondary | ICD-10-CM | POA: Diagnosis not present

## 2015-04-22 DIAGNOSIS — F329 Major depressive disorder, single episode, unspecified: Secondary | ICD-10-CM | POA: Diagnosis not present

## 2015-04-22 DIAGNOSIS — R350 Frequency of micturition: Secondary | ICD-10-CM | POA: Diagnosis not present

## 2015-04-22 DIAGNOSIS — E038 Other specified hypothyroidism: Secondary | ICD-10-CM | POA: Diagnosis not present

## 2015-04-29 DIAGNOSIS — R350 Frequency of micturition: Secondary | ICD-10-CM | POA: Diagnosis not present

## 2015-04-29 DIAGNOSIS — E038 Other specified hypothyroidism: Secondary | ICD-10-CM | POA: Diagnosis not present

## 2015-04-29 DIAGNOSIS — M6281 Muscle weakness (generalized): Secondary | ICD-10-CM | POA: Diagnosis not present

## 2015-04-29 DIAGNOSIS — F329 Major depressive disorder, single episode, unspecified: Secondary | ICD-10-CM | POA: Diagnosis not present

## 2015-04-29 DIAGNOSIS — I2510 Atherosclerotic heart disease of native coronary artery without angina pectoris: Secondary | ICD-10-CM | POA: Diagnosis not present

## 2015-04-29 DIAGNOSIS — E669 Obesity, unspecified: Secondary | ICD-10-CM | POA: Diagnosis not present

## 2015-04-29 DIAGNOSIS — I10 Essential (primary) hypertension: Secondary | ICD-10-CM | POA: Diagnosis not present

## 2015-04-29 DIAGNOSIS — F17211 Nicotine dependence, cigarettes, in remission: Secondary | ICD-10-CM | POA: Diagnosis not present

## 2015-04-29 DIAGNOSIS — G301 Alzheimer's disease with late onset: Secondary | ICD-10-CM | POA: Diagnosis not present

## 2015-05-05 DIAGNOSIS — I2510 Atherosclerotic heart disease of native coronary artery without angina pectoris: Secondary | ICD-10-CM | POA: Diagnosis not present

## 2015-05-05 DIAGNOSIS — F329 Major depressive disorder, single episode, unspecified: Secondary | ICD-10-CM | POA: Diagnosis not present

## 2015-05-05 DIAGNOSIS — G301 Alzheimer's disease with late onset: Secondary | ICD-10-CM | POA: Diagnosis not present

## 2015-05-05 DIAGNOSIS — F17211 Nicotine dependence, cigarettes, in remission: Secondary | ICD-10-CM | POA: Diagnosis not present

## 2015-05-05 DIAGNOSIS — I10 Essential (primary) hypertension: Secondary | ICD-10-CM | POA: Diagnosis not present

## 2015-05-05 DIAGNOSIS — E669 Obesity, unspecified: Secondary | ICD-10-CM | POA: Diagnosis not present

## 2015-05-05 DIAGNOSIS — R350 Frequency of micturition: Secondary | ICD-10-CM | POA: Diagnosis not present

## 2015-05-05 DIAGNOSIS — E038 Other specified hypothyroidism: Secondary | ICD-10-CM | POA: Diagnosis not present

## 2015-05-07 DIAGNOSIS — R32 Unspecified urinary incontinence: Secondary | ICD-10-CM | POA: Diagnosis not present

## 2015-05-07 DIAGNOSIS — G300 Alzheimer's disease with early onset: Secondary | ICD-10-CM | POA: Diagnosis not present

## 2015-05-13 DIAGNOSIS — G301 Alzheimer's disease with late onset: Secondary | ICD-10-CM | POA: Diagnosis not present

## 2015-05-13 DIAGNOSIS — E669 Obesity, unspecified: Secondary | ICD-10-CM | POA: Diagnosis not present

## 2015-05-13 DIAGNOSIS — E038 Other specified hypothyroidism: Secondary | ICD-10-CM | POA: Diagnosis not present

## 2015-05-13 DIAGNOSIS — F329 Major depressive disorder, single episode, unspecified: Secondary | ICD-10-CM | POA: Diagnosis not present

## 2015-05-13 DIAGNOSIS — F17211 Nicotine dependence, cigarettes, in remission: Secondary | ICD-10-CM | POA: Diagnosis not present

## 2015-05-13 DIAGNOSIS — R350 Frequency of micturition: Secondary | ICD-10-CM | POA: Diagnosis not present

## 2015-05-13 DIAGNOSIS — I2510 Atherosclerotic heart disease of native coronary artery without angina pectoris: Secondary | ICD-10-CM | POA: Diagnosis not present

## 2015-05-13 DIAGNOSIS — I10 Essential (primary) hypertension: Secondary | ICD-10-CM | POA: Diagnosis not present

## 2015-05-19 DIAGNOSIS — F329 Major depressive disorder, single episode, unspecified: Secondary | ICD-10-CM | POA: Diagnosis not present

## 2015-05-19 DIAGNOSIS — E038 Other specified hypothyroidism: Secondary | ICD-10-CM | POA: Diagnosis not present

## 2015-05-19 DIAGNOSIS — I10 Essential (primary) hypertension: Secondary | ICD-10-CM | POA: Diagnosis not present

## 2015-05-19 DIAGNOSIS — E669 Obesity, unspecified: Secondary | ICD-10-CM | POA: Diagnosis not present

## 2015-05-19 DIAGNOSIS — R350 Frequency of micturition: Secondary | ICD-10-CM | POA: Diagnosis not present

## 2015-05-19 DIAGNOSIS — G301 Alzheimer's disease with late onset: Secondary | ICD-10-CM | POA: Diagnosis not present

## 2015-05-19 DIAGNOSIS — I2510 Atherosclerotic heart disease of native coronary artery without angina pectoris: Secondary | ICD-10-CM | POA: Diagnosis not present

## 2015-05-19 DIAGNOSIS — F17211 Nicotine dependence, cigarettes, in remission: Secondary | ICD-10-CM | POA: Diagnosis not present

## 2015-05-20 DIAGNOSIS — G301 Alzheimer's disease with late onset: Secondary | ICD-10-CM | POA: Diagnosis not present

## 2015-05-20 DIAGNOSIS — I10 Essential (primary) hypertension: Secondary | ICD-10-CM | POA: Diagnosis not present

## 2015-05-20 DIAGNOSIS — F329 Major depressive disorder, single episode, unspecified: Secondary | ICD-10-CM | POA: Diagnosis not present

## 2015-05-20 DIAGNOSIS — I2510 Atherosclerotic heart disease of native coronary artery without angina pectoris: Secondary | ICD-10-CM | POA: Diagnosis not present

## 2015-05-26 DIAGNOSIS — F17211 Nicotine dependence, cigarettes, in remission: Secondary | ICD-10-CM | POA: Diagnosis not present

## 2015-05-26 DIAGNOSIS — F329 Major depressive disorder, single episode, unspecified: Secondary | ICD-10-CM | POA: Diagnosis not present

## 2015-05-26 DIAGNOSIS — E669 Obesity, unspecified: Secondary | ICD-10-CM | POA: Diagnosis not present

## 2015-05-26 DIAGNOSIS — I10 Essential (primary) hypertension: Secondary | ICD-10-CM | POA: Diagnosis not present

## 2015-05-26 DIAGNOSIS — R350 Frequency of micturition: Secondary | ICD-10-CM | POA: Diagnosis not present

## 2015-05-26 DIAGNOSIS — G301 Alzheimer's disease with late onset: Secondary | ICD-10-CM | POA: Diagnosis not present

## 2015-05-26 DIAGNOSIS — E038 Other specified hypothyroidism: Secondary | ICD-10-CM | POA: Diagnosis not present

## 2015-05-26 DIAGNOSIS — I2510 Atherosclerotic heart disease of native coronary artery without angina pectoris: Secondary | ICD-10-CM | POA: Diagnosis not present

## 2015-05-29 DIAGNOSIS — M6281 Muscle weakness (generalized): Secondary | ICD-10-CM | POA: Diagnosis not present

## 2015-05-29 DIAGNOSIS — E669 Obesity, unspecified: Secondary | ICD-10-CM | POA: Diagnosis not present

## 2015-06-03 DIAGNOSIS — E669 Obesity, unspecified: Secondary | ICD-10-CM | POA: Diagnosis not present

## 2015-06-03 DIAGNOSIS — I10 Essential (primary) hypertension: Secondary | ICD-10-CM | POA: Diagnosis not present

## 2015-06-03 DIAGNOSIS — F17211 Nicotine dependence, cigarettes, in remission: Secondary | ICD-10-CM | POA: Diagnosis not present

## 2015-06-03 DIAGNOSIS — E038 Other specified hypothyroidism: Secondary | ICD-10-CM | POA: Diagnosis not present

## 2015-06-03 DIAGNOSIS — R350 Frequency of micturition: Secondary | ICD-10-CM | POA: Diagnosis not present

## 2015-06-03 DIAGNOSIS — F329 Major depressive disorder, single episode, unspecified: Secondary | ICD-10-CM | POA: Diagnosis not present

## 2015-06-03 DIAGNOSIS — I2510 Atherosclerotic heart disease of native coronary artery without angina pectoris: Secondary | ICD-10-CM | POA: Diagnosis not present

## 2015-06-03 DIAGNOSIS — G301 Alzheimer's disease with late onset: Secondary | ICD-10-CM | POA: Diagnosis not present

## 2015-06-08 DIAGNOSIS — R32 Unspecified urinary incontinence: Secondary | ICD-10-CM | POA: Diagnosis not present

## 2015-06-08 DIAGNOSIS — G300 Alzheimer's disease with early onset: Secondary | ICD-10-CM | POA: Diagnosis not present

## 2015-06-11 DIAGNOSIS — I10 Essential (primary) hypertension: Secondary | ICD-10-CM | POA: Diagnosis not present

## 2015-06-11 DIAGNOSIS — E038 Other specified hypothyroidism: Secondary | ICD-10-CM | POA: Diagnosis not present

## 2015-06-11 DIAGNOSIS — R350 Frequency of micturition: Secondary | ICD-10-CM | POA: Diagnosis not present

## 2015-06-11 DIAGNOSIS — F329 Major depressive disorder, single episode, unspecified: Secondary | ICD-10-CM | POA: Diagnosis not present

## 2015-06-11 DIAGNOSIS — G301 Alzheimer's disease with late onset: Secondary | ICD-10-CM | POA: Diagnosis not present

## 2015-06-11 DIAGNOSIS — E669 Obesity, unspecified: Secondary | ICD-10-CM | POA: Diagnosis not present

## 2015-06-11 DIAGNOSIS — F17211 Nicotine dependence, cigarettes, in remission: Secondary | ICD-10-CM | POA: Diagnosis not present

## 2015-06-11 DIAGNOSIS — I2510 Atherosclerotic heart disease of native coronary artery without angina pectoris: Secondary | ICD-10-CM | POA: Diagnosis not present

## 2015-06-17 DIAGNOSIS — E038 Other specified hypothyroidism: Secondary | ICD-10-CM | POA: Diagnosis not present

## 2015-06-17 DIAGNOSIS — E669 Obesity, unspecified: Secondary | ICD-10-CM | POA: Diagnosis not present

## 2015-06-17 DIAGNOSIS — I2510 Atherosclerotic heart disease of native coronary artery without angina pectoris: Secondary | ICD-10-CM | POA: Diagnosis not present

## 2015-06-17 DIAGNOSIS — F17211 Nicotine dependence, cigarettes, in remission: Secondary | ICD-10-CM | POA: Diagnosis not present

## 2015-06-17 DIAGNOSIS — F329 Major depressive disorder, single episode, unspecified: Secondary | ICD-10-CM | POA: Diagnosis not present

## 2015-06-17 DIAGNOSIS — I10 Essential (primary) hypertension: Secondary | ICD-10-CM | POA: Diagnosis not present

## 2015-06-17 DIAGNOSIS — G301 Alzheimer's disease with late onset: Secondary | ICD-10-CM | POA: Diagnosis not present

## 2015-06-17 DIAGNOSIS — R350 Frequency of micturition: Secondary | ICD-10-CM | POA: Diagnosis not present

## 2015-06-25 DIAGNOSIS — G301 Alzheimer's disease with late onset: Secondary | ICD-10-CM | POA: Diagnosis not present

## 2015-06-25 DIAGNOSIS — E669 Obesity, unspecified: Secondary | ICD-10-CM | POA: Diagnosis not present

## 2015-06-25 DIAGNOSIS — I2510 Atherosclerotic heart disease of native coronary artery without angina pectoris: Secondary | ICD-10-CM | POA: Diagnosis not present

## 2015-06-25 DIAGNOSIS — F329 Major depressive disorder, single episode, unspecified: Secondary | ICD-10-CM | POA: Diagnosis not present

## 2015-06-25 DIAGNOSIS — R350 Frequency of micturition: Secondary | ICD-10-CM | POA: Diagnosis not present

## 2015-06-25 DIAGNOSIS — I10 Essential (primary) hypertension: Secondary | ICD-10-CM | POA: Diagnosis not present

## 2015-06-25 DIAGNOSIS — F17211 Nicotine dependence, cigarettes, in remission: Secondary | ICD-10-CM | POA: Diagnosis not present

## 2015-06-25 DIAGNOSIS — E038 Other specified hypothyroidism: Secondary | ICD-10-CM | POA: Diagnosis not present

## 2015-06-29 DIAGNOSIS — M6281 Muscle weakness (generalized): Secondary | ICD-10-CM | POA: Diagnosis not present

## 2015-06-29 DIAGNOSIS — E669 Obesity, unspecified: Secondary | ICD-10-CM | POA: Diagnosis not present

## 2015-06-30 DIAGNOSIS — E038 Other specified hypothyroidism: Secondary | ICD-10-CM | POA: Diagnosis not present

## 2015-06-30 DIAGNOSIS — G301 Alzheimer's disease with late onset: Secondary | ICD-10-CM | POA: Diagnosis not present

## 2015-06-30 DIAGNOSIS — I2510 Atherosclerotic heart disease of native coronary artery without angina pectoris: Secondary | ICD-10-CM | POA: Diagnosis not present

## 2015-06-30 DIAGNOSIS — I10 Essential (primary) hypertension: Secondary | ICD-10-CM | POA: Diagnosis not present

## 2015-06-30 DIAGNOSIS — E669 Obesity, unspecified: Secondary | ICD-10-CM | POA: Diagnosis not present

## 2015-06-30 DIAGNOSIS — R350 Frequency of micturition: Secondary | ICD-10-CM | POA: Diagnosis not present

## 2015-06-30 DIAGNOSIS — F329 Major depressive disorder, single episode, unspecified: Secondary | ICD-10-CM | POA: Diagnosis not present

## 2015-06-30 DIAGNOSIS — F17211 Nicotine dependence, cigarettes, in remission: Secondary | ICD-10-CM | POA: Diagnosis not present

## 2015-07-07 DIAGNOSIS — G301 Alzheimer's disease with late onset: Secondary | ICD-10-CM | POA: Diagnosis not present

## 2015-07-07 DIAGNOSIS — E038 Other specified hypothyroidism: Secondary | ICD-10-CM | POA: Diagnosis not present

## 2015-07-07 DIAGNOSIS — R350 Frequency of micturition: Secondary | ICD-10-CM | POA: Diagnosis not present

## 2015-07-07 DIAGNOSIS — F17211 Nicotine dependence, cigarettes, in remission: Secondary | ICD-10-CM | POA: Diagnosis not present

## 2015-07-07 DIAGNOSIS — I10 Essential (primary) hypertension: Secondary | ICD-10-CM | POA: Diagnosis not present

## 2015-07-07 DIAGNOSIS — E669 Obesity, unspecified: Secondary | ICD-10-CM | POA: Diagnosis not present

## 2015-07-07 DIAGNOSIS — I2510 Atherosclerotic heart disease of native coronary artery without angina pectoris: Secondary | ICD-10-CM | POA: Diagnosis not present

## 2015-07-07 DIAGNOSIS — F329 Major depressive disorder, single episode, unspecified: Secondary | ICD-10-CM | POA: Diagnosis not present

## 2015-07-09 DIAGNOSIS — G300 Alzheimer's disease with early onset: Secondary | ICD-10-CM | POA: Diagnosis not present

## 2015-07-09 DIAGNOSIS — R32 Unspecified urinary incontinence: Secondary | ICD-10-CM | POA: Diagnosis not present

## 2015-07-12 DIAGNOSIS — E669 Obesity, unspecified: Secondary | ICD-10-CM | POA: Diagnosis not present

## 2015-07-12 DIAGNOSIS — I10 Essential (primary) hypertension: Secondary | ICD-10-CM | POA: Diagnosis not present

## 2015-07-12 DIAGNOSIS — R350 Frequency of micturition: Secondary | ICD-10-CM | POA: Diagnosis not present

## 2015-07-12 DIAGNOSIS — I2510 Atherosclerotic heart disease of native coronary artery without angina pectoris: Secondary | ICD-10-CM | POA: Diagnosis not present

## 2015-07-12 DIAGNOSIS — G301 Alzheimer's disease with late onset: Secondary | ICD-10-CM | POA: Diagnosis not present

## 2015-07-12 DIAGNOSIS — F17211 Nicotine dependence, cigarettes, in remission: Secondary | ICD-10-CM | POA: Diagnosis not present

## 2015-07-12 DIAGNOSIS — E038 Other specified hypothyroidism: Secondary | ICD-10-CM | POA: Diagnosis not present

## 2015-07-12 DIAGNOSIS — F329 Major depressive disorder, single episode, unspecified: Secondary | ICD-10-CM | POA: Diagnosis not present

## 2015-07-16 DIAGNOSIS — G301 Alzheimer's disease with late onset: Secondary | ICD-10-CM | POA: Diagnosis not present

## 2015-07-16 DIAGNOSIS — I10 Essential (primary) hypertension: Secondary | ICD-10-CM | POA: Diagnosis not present

## 2015-07-16 DIAGNOSIS — I2510 Atherosclerotic heart disease of native coronary artery without angina pectoris: Secondary | ICD-10-CM | POA: Diagnosis not present

## 2015-07-16 DIAGNOSIS — F329 Major depressive disorder, single episode, unspecified: Secondary | ICD-10-CM | POA: Diagnosis not present

## 2015-07-30 DIAGNOSIS — M6281 Muscle weakness (generalized): Secondary | ICD-10-CM | POA: Diagnosis not present

## 2015-07-30 DIAGNOSIS — E669 Obesity, unspecified: Secondary | ICD-10-CM | POA: Diagnosis not present

## 2015-08-03 DIAGNOSIS — I10 Essential (primary) hypertension: Secondary | ICD-10-CM | POA: Diagnosis not present

## 2015-08-03 DIAGNOSIS — E669 Obesity, unspecified: Secondary | ICD-10-CM | POA: Diagnosis not present

## 2015-08-03 DIAGNOSIS — F17211 Nicotine dependence, cigarettes, in remission: Secondary | ICD-10-CM | POA: Diagnosis not present

## 2015-08-03 DIAGNOSIS — F329 Major depressive disorder, single episode, unspecified: Secondary | ICD-10-CM | POA: Diagnosis not present

## 2015-08-03 DIAGNOSIS — I2510 Atherosclerotic heart disease of native coronary artery without angina pectoris: Secondary | ICD-10-CM | POA: Diagnosis not present

## 2015-08-03 DIAGNOSIS — R319 Hematuria, unspecified: Secondary | ICD-10-CM | POA: Diagnosis not present

## 2015-08-03 DIAGNOSIS — E038 Other specified hypothyroidism: Secondary | ICD-10-CM | POA: Diagnosis not present

## 2015-08-03 DIAGNOSIS — R350 Frequency of micturition: Secondary | ICD-10-CM | POA: Diagnosis not present

## 2015-08-03 DIAGNOSIS — G301 Alzheimer's disease with late onset: Secondary | ICD-10-CM | POA: Diagnosis not present

## 2015-08-06 DIAGNOSIS — G300 Alzheimer's disease with early onset: Secondary | ICD-10-CM | POA: Diagnosis not present

## 2015-08-11 DIAGNOSIS — F329 Major depressive disorder, single episode, unspecified: Secondary | ICD-10-CM | POA: Diagnosis not present

## 2015-08-11 DIAGNOSIS — G301 Alzheimer's disease with late onset: Secondary | ICD-10-CM | POA: Diagnosis not present

## 2015-08-11 DIAGNOSIS — I10 Essential (primary) hypertension: Secondary | ICD-10-CM | POA: Diagnosis not present

## 2015-08-11 DIAGNOSIS — Z23 Encounter for immunization: Secondary | ICD-10-CM | POA: Diagnosis not present

## 2015-08-11 DIAGNOSIS — I2510 Atherosclerotic heart disease of native coronary artery without angina pectoris: Secondary | ICD-10-CM | POA: Diagnosis not present

## 2015-08-11 DIAGNOSIS — F17211 Nicotine dependence, cigarettes, in remission: Secondary | ICD-10-CM | POA: Diagnosis not present

## 2015-08-11 DIAGNOSIS — R350 Frequency of micturition: Secondary | ICD-10-CM | POA: Diagnosis not present

## 2015-08-11 DIAGNOSIS — E038 Other specified hypothyroidism: Secondary | ICD-10-CM | POA: Diagnosis not present

## 2015-08-11 DIAGNOSIS — E669 Obesity, unspecified: Secondary | ICD-10-CM | POA: Diagnosis not present

## 2015-08-29 DIAGNOSIS — E669 Obesity, unspecified: Secondary | ICD-10-CM | POA: Diagnosis not present

## 2015-08-29 DIAGNOSIS — M6281 Muscle weakness (generalized): Secondary | ICD-10-CM | POA: Diagnosis not present

## 2015-09-06 DIAGNOSIS — G300 Alzheimer's disease with early onset: Secondary | ICD-10-CM | POA: Diagnosis not present

## 2015-09-08 DIAGNOSIS — I10 Essential (primary) hypertension: Secondary | ICD-10-CM | POA: Diagnosis not present

## 2015-09-14 DIAGNOSIS — F329 Major depressive disorder, single episode, unspecified: Secondary | ICD-10-CM | POA: Diagnosis not present

## 2015-09-14 DIAGNOSIS — G301 Alzheimer's disease with late onset: Secondary | ICD-10-CM | POA: Diagnosis not present

## 2015-09-14 DIAGNOSIS — I2510 Atherosclerotic heart disease of native coronary artery without angina pectoris: Secondary | ICD-10-CM | POA: Diagnosis not present

## 2015-09-24 DIAGNOSIS — I10 Essential (primary) hypertension: Secondary | ICD-10-CM | POA: Diagnosis not present

## 2015-09-24 DIAGNOSIS — N390 Urinary tract infection, site not specified: Secondary | ICD-10-CM | POA: Diagnosis not present

## 2015-09-28 DIAGNOSIS — G301 Alzheimer's disease with late onset: Secondary | ICD-10-CM | POA: Diagnosis not present

## 2015-09-28 DIAGNOSIS — I2510 Atherosclerotic heart disease of native coronary artery without angina pectoris: Secondary | ICD-10-CM | POA: Diagnosis not present

## 2015-09-28 DIAGNOSIS — I10 Essential (primary) hypertension: Secondary | ICD-10-CM | POA: Diagnosis not present

## 2015-09-29 DIAGNOSIS — E669 Obesity, unspecified: Secondary | ICD-10-CM | POA: Diagnosis not present

## 2015-09-29 DIAGNOSIS — M6281 Muscle weakness (generalized): Secondary | ICD-10-CM | POA: Diagnosis not present

## 2015-10-07 DIAGNOSIS — R32 Unspecified urinary incontinence: Secondary | ICD-10-CM | POA: Diagnosis not present

## 2015-10-07 DIAGNOSIS — G300 Alzheimer's disease with early onset: Secondary | ICD-10-CM | POA: Diagnosis not present

## 2015-10-14 DIAGNOSIS — I10 Essential (primary) hypertension: Secondary | ICD-10-CM | POA: Diagnosis not present

## 2015-10-14 DIAGNOSIS — G301 Alzheimer's disease with late onset: Secondary | ICD-10-CM | POA: Diagnosis not present

## 2015-10-14 DIAGNOSIS — I2510 Atherosclerotic heart disease of native coronary artery without angina pectoris: Secondary | ICD-10-CM | POA: Diagnosis not present

## 2015-10-29 DIAGNOSIS — E669 Obesity, unspecified: Secondary | ICD-10-CM | POA: Diagnosis not present

## 2015-10-29 DIAGNOSIS — M6281 Muscle weakness (generalized): Secondary | ICD-10-CM | POA: Diagnosis not present

## 2015-11-08 DIAGNOSIS — R32 Unspecified urinary incontinence: Secondary | ICD-10-CM | POA: Diagnosis not present

## 2015-11-08 DIAGNOSIS — G300 Alzheimer's disease with early onset: Secondary | ICD-10-CM | POA: Diagnosis not present

## 2015-11-17 DIAGNOSIS — I2510 Atherosclerotic heart disease of native coronary artery without angina pectoris: Secondary | ICD-10-CM | POA: Diagnosis not present

## 2015-11-17 DIAGNOSIS — G301 Alzheimer's disease with late onset: Secondary | ICD-10-CM | POA: Diagnosis not present

## 2015-11-30 DIAGNOSIS — N390 Urinary tract infection, site not specified: Secondary | ICD-10-CM | POA: Diagnosis not present

## 2015-12-08 DIAGNOSIS — G300 Alzheimer's disease with early onset: Secondary | ICD-10-CM | POA: Diagnosis not present

## 2015-12-08 DIAGNOSIS — R32 Unspecified urinary incontinence: Secondary | ICD-10-CM | POA: Diagnosis not present

## 2016-01-05 DIAGNOSIS — I10 Essential (primary) hypertension: Secondary | ICD-10-CM | POA: Diagnosis not present

## 2016-01-05 DIAGNOSIS — R32 Unspecified urinary incontinence: Secondary | ICD-10-CM | POA: Diagnosis not present

## 2016-01-05 DIAGNOSIS — I2510 Atherosclerotic heart disease of native coronary artery without angina pectoris: Secondary | ICD-10-CM | POA: Diagnosis not present

## 2016-01-05 DIAGNOSIS — G301 Alzheimer's disease with late onset: Secondary | ICD-10-CM | POA: Diagnosis not present

## 2016-01-05 DIAGNOSIS — G300 Alzheimer's disease with early onset: Secondary | ICD-10-CM | POA: Diagnosis not present

## 2016-01-06 DIAGNOSIS — R300 Dysuria: Secondary | ICD-10-CM | POA: Diagnosis not present

## 2016-01-14 DIAGNOSIS — G301 Alzheimer's disease with late onset: Secondary | ICD-10-CM | POA: Diagnosis not present

## 2016-01-14 DIAGNOSIS — E782 Mixed hyperlipidemia: Secondary | ICD-10-CM | POA: Diagnosis not present

## 2016-01-14 DIAGNOSIS — E039 Hypothyroidism, unspecified: Secondary | ICD-10-CM | POA: Diagnosis not present

## 2016-01-14 DIAGNOSIS — I10 Essential (primary) hypertension: Secondary | ICD-10-CM | POA: Diagnosis not present

## 2016-01-14 DIAGNOSIS — J301 Allergic rhinitis due to pollen: Secondary | ICD-10-CM | POA: Diagnosis not present

## 2016-03-06 DIAGNOSIS — G301 Alzheimer's disease with late onset: Secondary | ICD-10-CM | POA: Diagnosis not present

## 2016-03-06 DIAGNOSIS — N390 Urinary tract infection, site not specified: Secondary | ICD-10-CM | POA: Diagnosis not present

## 2016-03-06 DIAGNOSIS — I10 Essential (primary) hypertension: Secondary | ICD-10-CM | POA: Diagnosis not present

## 2016-03-06 DIAGNOSIS — I2510 Atherosclerotic heart disease of native coronary artery without angina pectoris: Secondary | ICD-10-CM | POA: Diagnosis not present

## 2016-03-06 DIAGNOSIS — G300 Alzheimer's disease with early onset: Secondary | ICD-10-CM | POA: Diagnosis not present

## 2016-03-06 DIAGNOSIS — R32 Unspecified urinary incontinence: Secondary | ICD-10-CM | POA: Diagnosis not present

## 2016-03-16 DIAGNOSIS — I2510 Atherosclerotic heart disease of native coronary artery without angina pectoris: Secondary | ICD-10-CM | POA: Diagnosis not present

## 2016-03-16 DIAGNOSIS — R918 Other nonspecific abnormal finding of lung field: Secondary | ICD-10-CM | POA: Diagnosis not present

## 2016-03-16 DIAGNOSIS — M6281 Muscle weakness (generalized): Secondary | ICD-10-CM | POA: Diagnosis not present

## 2016-03-16 DIAGNOSIS — J159 Unspecified bacterial pneumonia: Secondary | ICD-10-CM | POA: Diagnosis not present

## 2016-03-16 DIAGNOSIS — N390 Urinary tract infection, site not specified: Secondary | ICD-10-CM | POA: Diagnosis not present

## 2016-03-16 DIAGNOSIS — Z882 Allergy status to sulfonamides status: Secondary | ICD-10-CM | POA: Diagnosis not present

## 2016-03-16 DIAGNOSIS — Z7982 Long term (current) use of aspirin: Secondary | ICD-10-CM | POA: Diagnosis not present

## 2016-03-16 DIAGNOSIS — Z9181 History of falling: Secondary | ICD-10-CM | POA: Diagnosis not present

## 2016-03-16 DIAGNOSIS — R1311 Dysphagia, oral phase: Secondary | ICD-10-CM | POA: Diagnosis not present

## 2016-03-16 DIAGNOSIS — R319 Hematuria, unspecified: Secondary | ICD-10-CM | POA: Diagnosis not present

## 2016-03-16 DIAGNOSIS — J189 Pneumonia, unspecified organism: Secondary | ICD-10-CM | POA: Diagnosis not present

## 2016-03-16 DIAGNOSIS — Z7401 Bed confinement status: Secondary | ICD-10-CM | POA: Diagnosis not present

## 2016-03-16 DIAGNOSIS — R402421 Glasgow coma scale score 9-12, in the field [EMT or ambulance]: Secondary | ICD-10-CM | POA: Diagnosis not present

## 2016-03-16 DIAGNOSIS — S99822D Other specified injuries of left foot, subsequent encounter: Secondary | ICD-10-CM | POA: Diagnosis not present

## 2016-03-16 DIAGNOSIS — B9620 Unspecified Escherichia coli [E. coli] as the cause of diseases classified elsewhere: Secondary | ICD-10-CM | POA: Diagnosis not present

## 2016-03-16 DIAGNOSIS — E039 Hypothyroidism, unspecified: Secondary | ICD-10-CM | POA: Diagnosis not present

## 2016-03-16 DIAGNOSIS — Z79899 Other long term (current) drug therapy: Secondary | ICD-10-CM | POA: Diagnosis not present

## 2016-03-16 DIAGNOSIS — C541 Malignant neoplasm of endometrium: Secondary | ICD-10-CM | POA: Diagnosis not present

## 2016-03-16 DIAGNOSIS — N3001 Acute cystitis with hematuria: Secondary | ICD-10-CM | POA: Diagnosis not present

## 2016-03-16 DIAGNOSIS — Z91030 Bee allergy status: Secondary | ICD-10-CM | POA: Diagnosis not present

## 2016-03-16 DIAGNOSIS — Z880 Allergy status to penicillin: Secondary | ICD-10-CM | POA: Diagnosis not present

## 2016-03-16 DIAGNOSIS — K219 Gastro-esophageal reflux disease without esophagitis: Secondary | ICD-10-CM | POA: Diagnosis not present

## 2016-03-16 DIAGNOSIS — I10 Essential (primary) hypertension: Secondary | ICD-10-CM | POA: Diagnosis not present

## 2016-03-16 DIAGNOSIS — Z888 Allergy status to other drugs, medicaments and biological substances status: Secondary | ICD-10-CM | POA: Diagnosis not present

## 2016-03-16 DIAGNOSIS — G309 Alzheimer's disease, unspecified: Secondary | ICD-10-CM | POA: Diagnosis not present

## 2016-03-16 DIAGNOSIS — D638 Anemia in other chronic diseases classified elsewhere: Secondary | ICD-10-CM | POA: Diagnosis not present

## 2016-03-21 DIAGNOSIS — J159 Unspecified bacterial pneumonia: Secondary | ICD-10-CM | POA: Diagnosis not present

## 2016-03-21 DIAGNOSIS — R1311 Dysphagia, oral phase: Secondary | ICD-10-CM | POA: Diagnosis not present

## 2016-03-21 DIAGNOSIS — E039 Hypothyroidism, unspecified: Secondary | ICD-10-CM | POA: Diagnosis not present

## 2016-03-21 DIAGNOSIS — Z8673 Personal history of transient ischemic attack (TIA), and cerebral infarction without residual deficits: Secondary | ICD-10-CM | POA: Diagnosis not present

## 2016-03-21 DIAGNOSIS — G309 Alzheimer's disease, unspecified: Secondary | ICD-10-CM | POA: Diagnosis not present

## 2016-03-21 DIAGNOSIS — Z66 Do not resuscitate: Secondary | ICD-10-CM | POA: Diagnosis not present

## 2016-03-21 DIAGNOSIS — R509 Fever, unspecified: Secondary | ICD-10-CM | POA: Diagnosis not present

## 2016-03-21 DIAGNOSIS — N3001 Acute cystitis with hematuria: Secondary | ICD-10-CM | POA: Diagnosis not present

## 2016-03-21 DIAGNOSIS — S99822D Other specified injuries of left foot, subsequent encounter: Secondary | ICD-10-CM | POA: Diagnosis not present

## 2016-03-21 DIAGNOSIS — R030 Elevated blood-pressure reading, without diagnosis of hypertension: Secondary | ICD-10-CM | POA: Diagnosis not present

## 2016-03-21 DIAGNOSIS — J189 Pneumonia, unspecified organism: Secondary | ICD-10-CM | POA: Diagnosis not present

## 2016-03-21 DIAGNOSIS — J9811 Atelectasis: Secondary | ICD-10-CM | POA: Diagnosis not present

## 2016-03-21 DIAGNOSIS — Z79899 Other long term (current) drug therapy: Secondary | ICD-10-CM | POA: Diagnosis not present

## 2016-03-21 DIAGNOSIS — A419 Sepsis, unspecified organism: Secondary | ICD-10-CM | POA: Diagnosis not present

## 2016-03-21 DIAGNOSIS — N390 Urinary tract infection, site not specified: Secondary | ICD-10-CM | POA: Diagnosis not present

## 2016-03-21 DIAGNOSIS — Z87440 Personal history of urinary (tract) infections: Secondary | ICD-10-CM | POA: Diagnosis not present

## 2016-03-21 DIAGNOSIS — C541 Malignant neoplasm of endometrium: Secondary | ICD-10-CM | POA: Diagnosis not present

## 2016-03-21 DIAGNOSIS — I2510 Atherosclerotic heart disease of native coronary artery without angina pectoris: Secondary | ICD-10-CM | POA: Diagnosis not present

## 2016-03-21 DIAGNOSIS — I252 Old myocardial infarction: Secondary | ICD-10-CM | POA: Diagnosis not present

## 2016-03-21 DIAGNOSIS — B9620 Unspecified Escherichia coli [E. coli] as the cause of diseases classified elsewhere: Secondary | ICD-10-CM | POA: Diagnosis not present

## 2016-03-21 DIAGNOSIS — Z9181 History of falling: Secondary | ICD-10-CM | POA: Diagnosis not present

## 2016-03-21 DIAGNOSIS — K219 Gastro-esophageal reflux disease without esophagitis: Secondary | ICD-10-CM | POA: Diagnosis not present

## 2016-03-21 DIAGNOSIS — M6281 Muscle weakness (generalized): Secondary | ICD-10-CM | POA: Diagnosis not present

## 2016-03-21 DIAGNOSIS — Z7401 Bed confinement status: Secondary | ICD-10-CM | POA: Diagnosis not present

## 2016-03-21 DIAGNOSIS — I10 Essential (primary) hypertension: Secondary | ICD-10-CM | POA: Diagnosis not present

## 2016-03-23 DIAGNOSIS — Z66 Do not resuscitate: Secondary | ICD-10-CM | POA: Diagnosis not present

## 2016-03-23 DIAGNOSIS — Z79899 Other long term (current) drug therapy: Secondary | ICD-10-CM | POA: Diagnosis not present

## 2016-03-23 DIAGNOSIS — R509 Fever, unspecified: Secondary | ICD-10-CM | POA: Diagnosis not present

## 2016-03-23 DIAGNOSIS — I10 Essential (primary) hypertension: Secondary | ICD-10-CM | POA: Diagnosis not present

## 2016-03-23 DIAGNOSIS — K219 Gastro-esophageal reflux disease without esophagitis: Secondary | ICD-10-CM | POA: Diagnosis not present

## 2016-03-23 DIAGNOSIS — I252 Old myocardial infarction: Secondary | ICD-10-CM | POA: Diagnosis not present

## 2016-03-23 DIAGNOSIS — J189 Pneumonia, unspecified organism: Secondary | ICD-10-CM | POA: Diagnosis not present

## 2016-03-23 DIAGNOSIS — J9811 Atelectasis: Secondary | ICD-10-CM | POA: Diagnosis not present

## 2016-03-23 DIAGNOSIS — Z87440 Personal history of urinary (tract) infections: Secondary | ICD-10-CM | POA: Diagnosis not present

## 2016-03-23 DIAGNOSIS — A419 Sepsis, unspecified organism: Secondary | ICD-10-CM | POA: Diagnosis not present

## 2016-03-23 DIAGNOSIS — G309 Alzheimer's disease, unspecified: Secondary | ICD-10-CM | POA: Diagnosis not present

## 2016-03-23 DIAGNOSIS — Z8673 Personal history of transient ischemic attack (TIA), and cerebral infarction without residual deficits: Secondary | ICD-10-CM | POA: Diagnosis not present

## 2016-03-24 DIAGNOSIS — R32 Unspecified urinary incontinence: Secondary | ICD-10-CM | POA: Diagnosis not present

## 2016-03-24 DIAGNOSIS — G309 Alzheimer's disease, unspecified: Secondary | ICD-10-CM | POA: Diagnosis not present

## 2016-03-24 DIAGNOSIS — Z66 Do not resuscitate: Secondary | ICD-10-CM | POA: Diagnosis not present

## 2016-03-24 DIAGNOSIS — G300 Alzheimer's disease with early onset: Secondary | ICD-10-CM | POA: Diagnosis not present

## 2016-03-24 DIAGNOSIS — S99822D Other specified injuries of left foot, subsequent encounter: Secondary | ICD-10-CM | POA: Diagnosis not present

## 2016-03-24 DIAGNOSIS — R1311 Dysphagia, oral phase: Secondary | ICD-10-CM | POA: Diagnosis not present

## 2016-03-24 DIAGNOSIS — C541 Malignant neoplasm of endometrium: Secondary | ICD-10-CM | POA: Diagnosis not present

## 2016-03-24 DIAGNOSIS — A419 Sepsis, unspecified organism: Secondary | ICD-10-CM | POA: Diagnosis not present

## 2016-03-24 DIAGNOSIS — Z79899 Other long term (current) drug therapy: Secondary | ICD-10-CM | POA: Diagnosis not present

## 2016-03-24 DIAGNOSIS — I252 Old myocardial infarction: Secondary | ICD-10-CM | POA: Diagnosis not present

## 2016-03-24 DIAGNOSIS — Z9989 Dependence on other enabling machines and devices: Secondary | ICD-10-CM | POA: Diagnosis not present

## 2016-03-24 DIAGNOSIS — B9620 Unspecified Escherichia coli [E. coli] as the cause of diseases classified elsewhere: Secondary | ICD-10-CM | POA: Diagnosis not present

## 2016-03-24 DIAGNOSIS — M6281 Muscle weakness (generalized): Secondary | ICD-10-CM | POA: Diagnosis not present

## 2016-03-24 DIAGNOSIS — Z87440 Personal history of urinary (tract) infections: Secondary | ICD-10-CM | POA: Diagnosis not present

## 2016-03-24 DIAGNOSIS — I10 Essential (primary) hypertension: Secondary | ICD-10-CM | POA: Diagnosis not present

## 2016-03-24 DIAGNOSIS — R279 Unspecified lack of coordination: Secondary | ICD-10-CM | POA: Diagnosis not present

## 2016-03-24 DIAGNOSIS — J159 Unspecified bacterial pneumonia: Secondary | ICD-10-CM | POA: Diagnosis not present

## 2016-03-24 DIAGNOSIS — N390 Urinary tract infection, site not specified: Secondary | ICD-10-CM | POA: Diagnosis not present

## 2016-03-24 DIAGNOSIS — I2510 Atherosclerotic heart disease of native coronary artery without angina pectoris: Secondary | ICD-10-CM | POA: Diagnosis not present

## 2016-03-24 DIAGNOSIS — N3001 Acute cystitis with hematuria: Secondary | ICD-10-CM | POA: Diagnosis not present

## 2016-03-24 DIAGNOSIS — J189 Pneumonia, unspecified organism: Secondary | ICD-10-CM | POA: Diagnosis not present

## 2016-03-24 DIAGNOSIS — E039 Hypothyroidism, unspecified: Secondary | ICD-10-CM | POA: Diagnosis not present

## 2016-03-24 DIAGNOSIS — Z8673 Personal history of transient ischemic attack (TIA), and cerebral infarction without residual deficits: Secondary | ICD-10-CM | POA: Diagnosis not present

## 2016-03-24 DIAGNOSIS — K219 Gastro-esophageal reflux disease without esophagitis: Secondary | ICD-10-CM | POA: Diagnosis not present

## 2016-03-26 DIAGNOSIS — J189 Pneumonia, unspecified organism: Secondary | ICD-10-CM | POA: Diagnosis not present

## 2016-03-26 DIAGNOSIS — N3001 Acute cystitis with hematuria: Secondary | ICD-10-CM | POA: Diagnosis not present

## 2016-04-06 DIAGNOSIS — G300 Alzheimer's disease with early onset: Secondary | ICD-10-CM | POA: Diagnosis not present

## 2016-04-06 DIAGNOSIS — R32 Unspecified urinary incontinence: Secondary | ICD-10-CM | POA: Diagnosis not present

## 2016-04-10 DIAGNOSIS — M6281 Muscle weakness (generalized): Secondary | ICD-10-CM | POA: Diagnosis not present

## 2016-04-10 DIAGNOSIS — G309 Alzheimer's disease, unspecified: Secondary | ICD-10-CM | POA: Diagnosis not present

## 2016-04-10 DIAGNOSIS — I25110 Atherosclerotic heart disease of native coronary artery with unstable angina pectoris: Secondary | ICD-10-CM | POA: Diagnosis not present

## 2016-04-12 DIAGNOSIS — G309 Alzheimer's disease, unspecified: Secondary | ICD-10-CM | POA: Diagnosis not present

## 2016-04-12 DIAGNOSIS — K219 Gastro-esophageal reflux disease without esophagitis: Secondary | ICD-10-CM | POA: Diagnosis not present

## 2016-04-12 DIAGNOSIS — I25110 Atherosclerotic heart disease of native coronary artery with unstable angina pectoris: Secondary | ICD-10-CM | POA: Diagnosis not present

## 2016-04-12 DIAGNOSIS — M6281 Muscle weakness (generalized): Secondary | ICD-10-CM | POA: Diagnosis not present

## 2016-04-12 DIAGNOSIS — E039 Hypothyroidism, unspecified: Secondary | ICD-10-CM | POA: Diagnosis not present

## 2016-04-12 DIAGNOSIS — D638 Anemia in other chronic diseases classified elsewhere: Secondary | ICD-10-CM | POA: Diagnosis not present

## 2016-04-12 DIAGNOSIS — Z87440 Personal history of urinary (tract) infections: Secondary | ICD-10-CM | POA: Diagnosis not present

## 2016-04-13 DIAGNOSIS — D638 Anemia in other chronic diseases classified elsewhere: Secondary | ICD-10-CM | POA: Diagnosis not present

## 2016-04-13 DIAGNOSIS — I25110 Atherosclerotic heart disease of native coronary artery with unstable angina pectoris: Secondary | ICD-10-CM | POA: Diagnosis not present

## 2016-04-13 DIAGNOSIS — G309 Alzheimer's disease, unspecified: Secondary | ICD-10-CM | POA: Diagnosis not present

## 2016-04-13 DIAGNOSIS — Z87440 Personal history of urinary (tract) infections: Secondary | ICD-10-CM | POA: Diagnosis not present

## 2016-04-13 DIAGNOSIS — M6281 Muscle weakness (generalized): Secondary | ICD-10-CM | POA: Diagnosis not present

## 2016-04-13 DIAGNOSIS — K219 Gastro-esophageal reflux disease without esophagitis: Secondary | ICD-10-CM | POA: Diagnosis not present

## 2016-04-13 DIAGNOSIS — E039 Hypothyroidism, unspecified: Secondary | ICD-10-CM | POA: Diagnosis not present

## 2016-04-18 DIAGNOSIS — M6281 Muscle weakness (generalized): Secondary | ICD-10-CM | POA: Diagnosis not present

## 2016-04-18 DIAGNOSIS — D638 Anemia in other chronic diseases classified elsewhere: Secondary | ICD-10-CM | POA: Diagnosis not present

## 2016-04-18 DIAGNOSIS — G309 Alzheimer's disease, unspecified: Secondary | ICD-10-CM | POA: Diagnosis not present

## 2016-04-18 DIAGNOSIS — K219 Gastro-esophageal reflux disease without esophagitis: Secondary | ICD-10-CM | POA: Diagnosis not present

## 2016-04-18 DIAGNOSIS — E039 Hypothyroidism, unspecified: Secondary | ICD-10-CM | POA: Diagnosis not present

## 2016-04-18 DIAGNOSIS — I25110 Atherosclerotic heart disease of native coronary artery with unstable angina pectoris: Secondary | ICD-10-CM | POA: Diagnosis not present

## 2016-04-18 DIAGNOSIS — Z87440 Personal history of urinary (tract) infections: Secondary | ICD-10-CM | POA: Diagnosis not present

## 2016-04-19 DIAGNOSIS — E039 Hypothyroidism, unspecified: Secondary | ICD-10-CM | POA: Diagnosis not present

## 2016-04-19 DIAGNOSIS — G309 Alzheimer's disease, unspecified: Secondary | ICD-10-CM | POA: Diagnosis not present

## 2016-04-19 DIAGNOSIS — Z87440 Personal history of urinary (tract) infections: Secondary | ICD-10-CM | POA: Diagnosis not present

## 2016-04-19 DIAGNOSIS — M6281 Muscle weakness (generalized): Secondary | ICD-10-CM | POA: Diagnosis not present

## 2016-04-19 DIAGNOSIS — K219 Gastro-esophageal reflux disease without esophagitis: Secondary | ICD-10-CM | POA: Diagnosis not present

## 2016-04-19 DIAGNOSIS — I25110 Atherosclerotic heart disease of native coronary artery with unstable angina pectoris: Secondary | ICD-10-CM | POA: Diagnosis not present

## 2016-04-19 DIAGNOSIS — D638 Anemia in other chronic diseases classified elsewhere: Secondary | ICD-10-CM | POA: Diagnosis not present

## 2016-04-25 DIAGNOSIS — G40909 Epilepsy, unspecified, not intractable, without status epilepticus: Secondary | ICD-10-CM | POA: Diagnosis not present

## 2016-04-25 DIAGNOSIS — N939 Abnormal uterine and vaginal bleeding, unspecified: Secondary | ICD-10-CM | POA: Diagnosis not present

## 2016-04-25 DIAGNOSIS — Z87440 Personal history of urinary (tract) infections: Secondary | ICD-10-CM | POA: Diagnosis not present

## 2016-04-25 DIAGNOSIS — M6281 Muscle weakness (generalized): Secondary | ICD-10-CM | POA: Diagnosis not present

## 2016-04-25 DIAGNOSIS — D638 Anemia in other chronic diseases classified elsewhere: Secondary | ICD-10-CM | POA: Diagnosis not present

## 2016-04-25 DIAGNOSIS — F0280 Dementia in other diseases classified elsewhere without behavioral disturbance: Secondary | ICD-10-CM | POA: Diagnosis not present

## 2016-04-25 DIAGNOSIS — F341 Dysthymic disorder: Secondary | ICD-10-CM | POA: Diagnosis not present

## 2016-04-25 DIAGNOSIS — Z8701 Personal history of pneumonia (recurrent): Secondary | ICD-10-CM | POA: Diagnosis not present

## 2016-04-25 DIAGNOSIS — G309 Alzheimer's disease, unspecified: Secondary | ICD-10-CM | POA: Diagnosis not present

## 2016-04-25 DIAGNOSIS — E039 Hypothyroidism, unspecified: Secondary | ICD-10-CM | POA: Diagnosis not present

## 2016-04-25 DIAGNOSIS — K219 Gastro-esophageal reflux disease without esophagitis: Secondary | ICD-10-CM | POA: Diagnosis not present

## 2016-04-25 DIAGNOSIS — I25110 Atherosclerotic heart disease of native coronary artery with unstable angina pectoris: Secondary | ICD-10-CM | POA: Diagnosis not present

## 2016-05-02 DIAGNOSIS — G40909 Epilepsy, unspecified, not intractable, without status epilepticus: Secondary | ICD-10-CM | POA: Diagnosis not present

## 2016-05-02 DIAGNOSIS — Z87440 Personal history of urinary (tract) infections: Secondary | ICD-10-CM | POA: Diagnosis not present

## 2016-05-02 DIAGNOSIS — M6281 Muscle weakness (generalized): Secondary | ICD-10-CM | POA: Diagnosis not present

## 2016-05-02 DIAGNOSIS — E039 Hypothyroidism, unspecified: Secondary | ICD-10-CM | POA: Diagnosis not present

## 2016-05-02 DIAGNOSIS — G309 Alzheimer's disease, unspecified: Secondary | ICD-10-CM | POA: Diagnosis not present

## 2016-05-02 DIAGNOSIS — F341 Dysthymic disorder: Secondary | ICD-10-CM | POA: Diagnosis not present

## 2016-05-02 DIAGNOSIS — Z8701 Personal history of pneumonia (recurrent): Secondary | ICD-10-CM | POA: Diagnosis not present

## 2016-05-02 DIAGNOSIS — F0280 Dementia in other diseases classified elsewhere without behavioral disturbance: Secondary | ICD-10-CM | POA: Diagnosis not present

## 2016-05-02 DIAGNOSIS — N939 Abnormal uterine and vaginal bleeding, unspecified: Secondary | ICD-10-CM | POA: Diagnosis not present

## 2016-05-02 DIAGNOSIS — K219 Gastro-esophageal reflux disease without esophagitis: Secondary | ICD-10-CM | POA: Diagnosis not present

## 2016-05-02 DIAGNOSIS — I25110 Atherosclerotic heart disease of native coronary artery with unstable angina pectoris: Secondary | ICD-10-CM | POA: Diagnosis not present

## 2016-05-02 DIAGNOSIS — D638 Anemia in other chronic diseases classified elsewhere: Secondary | ICD-10-CM | POA: Diagnosis not present

## 2016-05-04 DIAGNOSIS — M6281 Muscle weakness (generalized): Secondary | ICD-10-CM | POA: Diagnosis not present

## 2016-05-06 DIAGNOSIS — G300 Alzheimer's disease with early onset: Secondary | ICD-10-CM | POA: Diagnosis not present

## 2016-05-09 DIAGNOSIS — E782 Mixed hyperlipidemia: Secondary | ICD-10-CM | POA: Diagnosis not present

## 2016-05-09 DIAGNOSIS — J301 Allergic rhinitis due to pollen: Secondary | ICD-10-CM | POA: Diagnosis not present

## 2016-05-09 DIAGNOSIS — I25110 Atherosclerotic heart disease of native coronary artery with unstable angina pectoris: Secondary | ICD-10-CM | POA: Diagnosis not present

## 2016-05-09 DIAGNOSIS — G309 Alzheimer's disease, unspecified: Secondary | ICD-10-CM | POA: Diagnosis not present

## 2016-05-09 DIAGNOSIS — E039 Hypothyroidism, unspecified: Secondary | ICD-10-CM | POA: Diagnosis not present

## 2016-05-09 DIAGNOSIS — M6281 Muscle weakness (generalized): Secondary | ICD-10-CM | POA: Diagnosis not present

## 2016-05-09 DIAGNOSIS — G301 Alzheimer's disease with late onset: Secondary | ICD-10-CM | POA: Diagnosis not present

## 2016-05-09 DIAGNOSIS — K219 Gastro-esophageal reflux disease without esophagitis: Secondary | ICD-10-CM | POA: Diagnosis not present

## 2016-05-09 DIAGNOSIS — I10 Essential (primary) hypertension: Secondary | ICD-10-CM | POA: Diagnosis not present

## 2016-05-09 DIAGNOSIS — D638 Anemia in other chronic diseases classified elsewhere: Secondary | ICD-10-CM | POA: Diagnosis not present

## 2016-05-09 DIAGNOSIS — Z87440 Personal history of urinary (tract) infections: Secondary | ICD-10-CM | POA: Diagnosis not present

## 2016-06-06 DIAGNOSIS — M6281 Muscle weakness (generalized): Secondary | ICD-10-CM | POA: Diagnosis not present

## 2016-06-06 DIAGNOSIS — F0280 Dementia in other diseases classified elsewhere without behavioral disturbance: Secondary | ICD-10-CM | POA: Diagnosis not present

## 2016-06-06 DIAGNOSIS — N939 Abnormal uterine and vaginal bleeding, unspecified: Secondary | ICD-10-CM | POA: Diagnosis not present

## 2016-06-06 DIAGNOSIS — Z87440 Personal history of urinary (tract) infections: Secondary | ICD-10-CM | POA: Diagnosis not present

## 2016-06-06 DIAGNOSIS — K219 Gastro-esophageal reflux disease without esophagitis: Secondary | ICD-10-CM | POA: Diagnosis not present

## 2016-06-06 DIAGNOSIS — E039 Hypothyroidism, unspecified: Secondary | ICD-10-CM | POA: Diagnosis not present

## 2016-06-06 DIAGNOSIS — G300 Alzheimer's disease with early onset: Secondary | ICD-10-CM | POA: Diagnosis not present

## 2016-06-06 DIAGNOSIS — G40909 Epilepsy, unspecified, not intractable, without status epilepticus: Secondary | ICD-10-CM | POA: Diagnosis not present

## 2016-06-06 DIAGNOSIS — D638 Anemia in other chronic diseases classified elsewhere: Secondary | ICD-10-CM | POA: Diagnosis not present

## 2016-06-06 DIAGNOSIS — I25110 Atherosclerotic heart disease of native coronary artery with unstable angina pectoris: Secondary | ICD-10-CM | POA: Diagnosis not present

## 2016-06-06 DIAGNOSIS — Z8701 Personal history of pneumonia (recurrent): Secondary | ICD-10-CM | POA: Diagnosis not present

## 2016-06-06 DIAGNOSIS — F341 Dysthymic disorder: Secondary | ICD-10-CM | POA: Diagnosis not present

## 2016-06-06 DIAGNOSIS — G309 Alzheimer's disease, unspecified: Secondary | ICD-10-CM | POA: Diagnosis not present

## 2016-06-11 DIAGNOSIS — I25110 Atherosclerotic heart disease of native coronary artery with unstable angina pectoris: Secondary | ICD-10-CM | POA: Diagnosis not present

## 2016-06-11 DIAGNOSIS — G309 Alzheimer's disease, unspecified: Secondary | ICD-10-CM | POA: Diagnosis not present

## 2016-06-11 DIAGNOSIS — M6281 Muscle weakness (generalized): Secondary | ICD-10-CM | POA: Diagnosis not present

## 2016-06-29 DIAGNOSIS — M6281 Muscle weakness (generalized): Secondary | ICD-10-CM | POA: Diagnosis not present

## 2016-06-30 DIAGNOSIS — R05 Cough: Secondary | ICD-10-CM | POA: Diagnosis not present

## 2016-07-05 DIAGNOSIS — M6281 Muscle weakness (generalized): Secondary | ICD-10-CM | POA: Diagnosis not present

## 2016-07-05 DIAGNOSIS — N390 Urinary tract infection, site not specified: Secondary | ICD-10-CM | POA: Diagnosis not present

## 2016-07-06 DIAGNOSIS — J449 Chronic obstructive pulmonary disease, unspecified: Secondary | ICD-10-CM | POA: Diagnosis not present

## 2016-07-07 DIAGNOSIS — G300 Alzheimer's disease with early onset: Secondary | ICD-10-CM | POA: Diagnosis not present

## 2016-08-05 DIAGNOSIS — J449 Chronic obstructive pulmonary disease, unspecified: Secondary | ICD-10-CM | POA: Diagnosis not present

## 2016-08-06 DIAGNOSIS — G300 Alzheimer's disease with early onset: Secondary | ICD-10-CM | POA: Diagnosis not present

## 2016-08-07 DIAGNOSIS — M6281 Muscle weakness (generalized): Secondary | ICD-10-CM | POA: Diagnosis not present

## 2016-08-10 DIAGNOSIS — I25110 Atherosclerotic heart disease of native coronary artery with unstable angina pectoris: Secondary | ICD-10-CM | POA: Diagnosis not present

## 2016-08-10 DIAGNOSIS — G309 Alzheimer's disease, unspecified: Secondary | ICD-10-CM | POA: Diagnosis not present

## 2016-08-10 DIAGNOSIS — M6281 Muscle weakness (generalized): Secondary | ICD-10-CM | POA: Diagnosis not present

## 2016-09-04 DIAGNOSIS — M6281 Muscle weakness (generalized): Secondary | ICD-10-CM | POA: Diagnosis not present

## 2016-09-05 DIAGNOSIS — J449 Chronic obstructive pulmonary disease, unspecified: Secondary | ICD-10-CM | POA: Diagnosis not present

## 2016-09-06 DIAGNOSIS — Z23 Encounter for immunization: Secondary | ICD-10-CM | POA: Diagnosis not present

## 2016-09-06 DIAGNOSIS — G300 Alzheimer's disease with early onset: Secondary | ICD-10-CM | POA: Diagnosis not present

## 2016-09-06 DIAGNOSIS — D649 Anemia, unspecified: Secondary | ICD-10-CM | POA: Diagnosis not present

## 2016-09-06 DIAGNOSIS — M6281 Muscle weakness (generalized): Secondary | ICD-10-CM | POA: Diagnosis not present

## 2016-10-04 DIAGNOSIS — M6281 Muscle weakness (generalized): Secondary | ICD-10-CM | POA: Diagnosis not present

## 2016-10-05 DIAGNOSIS — J449 Chronic obstructive pulmonary disease, unspecified: Secondary | ICD-10-CM | POA: Diagnosis not present

## 2016-10-06 DIAGNOSIS — G300 Alzheimer's disease with early onset: Secondary | ICD-10-CM | POA: Diagnosis not present

## 2016-10-09 DIAGNOSIS — G309 Alzheimer's disease, unspecified: Secondary | ICD-10-CM | POA: Diagnosis not present

## 2016-10-09 DIAGNOSIS — I25110 Atherosclerotic heart disease of native coronary artery with unstable angina pectoris: Secondary | ICD-10-CM | POA: Diagnosis not present

## 2016-10-09 DIAGNOSIS — M6281 Muscle weakness (generalized): Secondary | ICD-10-CM | POA: Diagnosis not present

## 2016-10-27 DIAGNOSIS — Z79899 Other long term (current) drug therapy: Secondary | ICD-10-CM | POA: Diagnosis not present

## 2016-10-27 DIAGNOSIS — Z5181 Encounter for therapeutic drug level monitoring: Secondary | ICD-10-CM | POA: Diagnosis not present

## 2016-10-27 DIAGNOSIS — M6281 Muscle weakness (generalized): Secondary | ICD-10-CM | POA: Diagnosis not present

## 2016-11-03 DIAGNOSIS — I10 Essential (primary) hypertension: Secondary | ICD-10-CM | POA: Diagnosis not present

## 2016-11-03 DIAGNOSIS — Z79899 Other long term (current) drug therapy: Secondary | ICD-10-CM | POA: Diagnosis not present

## 2016-11-03 DIAGNOSIS — R404 Transient alteration of awareness: Secondary | ICD-10-CM | POA: Diagnosis not present

## 2016-11-03 DIAGNOSIS — G309 Alzheimer's disease, unspecified: Secondary | ICD-10-CM | POA: Diagnosis not present

## 2016-11-03 DIAGNOSIS — Z8673 Personal history of transient ischemic attack (TIA), and cerebral infarction without residual deficits: Secondary | ICD-10-CM | POA: Diagnosis not present

## 2016-11-03 DIAGNOSIS — K219 Gastro-esophageal reflux disease without esophagitis: Secondary | ICD-10-CM | POA: Diagnosis not present

## 2016-11-03 DIAGNOSIS — I252 Old myocardial infarction: Secondary | ICD-10-CM | POA: Diagnosis not present

## 2016-11-05 DIAGNOSIS — J449 Chronic obstructive pulmonary disease, unspecified: Secondary | ICD-10-CM | POA: Diagnosis not present

## 2016-11-27 DIAGNOSIS — N390 Urinary tract infection, site not specified: Secondary | ICD-10-CM | POA: Diagnosis not present

## 2016-11-27 DIAGNOSIS — E039 Hypothyroidism, unspecified: Secondary | ICD-10-CM | POA: Diagnosis not present

## 2016-11-27 DIAGNOSIS — I25110 Atherosclerotic heart disease of native coronary artery with unstable angina pectoris: Secondary | ICD-10-CM | POA: Diagnosis not present

## 2016-11-27 DIAGNOSIS — Z87440 Personal history of urinary (tract) infections: Secondary | ICD-10-CM | POA: Diagnosis not present

## 2016-11-27 DIAGNOSIS — D638 Anemia in other chronic diseases classified elsewhere: Secondary | ICD-10-CM | POA: Diagnosis not present

## 2016-11-27 DIAGNOSIS — G309 Alzheimer's disease, unspecified: Secondary | ICD-10-CM | POA: Diagnosis not present

## 2016-11-27 DIAGNOSIS — K219 Gastro-esophageal reflux disease without esophagitis: Secondary | ICD-10-CM | POA: Diagnosis not present

## 2016-11-27 DIAGNOSIS — M6281 Muscle weakness (generalized): Secondary | ICD-10-CM | POA: Diagnosis not present

## 2016-12-04 DIAGNOSIS — D638 Anemia in other chronic diseases classified elsewhere: Secondary | ICD-10-CM | POA: Diagnosis not present

## 2016-12-04 DIAGNOSIS — I25110 Atherosclerotic heart disease of native coronary artery with unstable angina pectoris: Secondary | ICD-10-CM | POA: Diagnosis not present

## 2016-12-04 DIAGNOSIS — Z87440 Personal history of urinary (tract) infections: Secondary | ICD-10-CM | POA: Diagnosis not present

## 2016-12-04 DIAGNOSIS — K219 Gastro-esophageal reflux disease without esophagitis: Secondary | ICD-10-CM | POA: Diagnosis not present

## 2016-12-04 DIAGNOSIS — G309 Alzheimer's disease, unspecified: Secondary | ICD-10-CM | POA: Diagnosis not present

## 2016-12-04 DIAGNOSIS — M6281 Muscle weakness (generalized): Secondary | ICD-10-CM | POA: Diagnosis not present

## 2016-12-04 DIAGNOSIS — E039 Hypothyroidism, unspecified: Secondary | ICD-10-CM | POA: Diagnosis not present

## 2016-12-06 DIAGNOSIS — J449 Chronic obstructive pulmonary disease, unspecified: Secondary | ICD-10-CM | POA: Diagnosis not present

## 2016-12-07 DIAGNOSIS — G300 Alzheimer's disease with early onset: Secondary | ICD-10-CM | POA: Diagnosis not present

## 2016-12-07 DIAGNOSIS — K5710 Diverticulosis of small intestine without perforation or abscess without bleeding: Secondary | ICD-10-CM | POA: Diagnosis not present

## 2016-12-08 DIAGNOSIS — G309 Alzheimer's disease, unspecified: Secondary | ICD-10-CM | POA: Diagnosis not present

## 2016-12-08 DIAGNOSIS — I25110 Atherosclerotic heart disease of native coronary artery with unstable angina pectoris: Secondary | ICD-10-CM | POA: Diagnosis not present

## 2016-12-08 DIAGNOSIS — M6281 Muscle weakness (generalized): Secondary | ICD-10-CM | POA: Diagnosis not present

## 2017-01-04 DIAGNOSIS — G300 Alzheimer's disease with early onset: Secondary | ICD-10-CM | POA: Diagnosis not present

## 2017-02-27 DEATH — deceased
